# Patient Record
Sex: Female | Born: 1985 | Hispanic: Yes | Marital: Married | Smoking: Never smoker
Health system: Southern US, Community
[De-identification: ages and names within clinical notes are randomized; demographics above are authoritative.]

## PROBLEM LIST (undated history)

## (undated) DIAGNOSIS — O24419 Gestational diabetes mellitus in pregnancy, unspecified control: Secondary | ICD-10-CM

## (undated) DIAGNOSIS — O139 Gestational [pregnancy-induced] hypertension without significant proteinuria, unspecified trimester: Secondary | ICD-10-CM

## (undated) DIAGNOSIS — F410 Panic disorder [episodic paroxysmal anxiety] without agoraphobia: Secondary | ICD-10-CM

## (undated) DIAGNOSIS — I499 Cardiac arrhythmia, unspecified: Secondary | ICD-10-CM

## (undated) HISTORY — DX: Gestational (pregnancy-induced) hypertension without significant proteinuria, unspecified trimester: O13.9

## (undated) HISTORY — PX: NO PAST SURGERIES: SHX2092

## (undated) HISTORY — DX: Cardiac arrhythmia, unspecified: I49.9

## (undated) HISTORY — DX: Panic disorder (episodic paroxysmal anxiety): F41.0

## (undated) HISTORY — DX: Gestational diabetes mellitus in pregnancy, unspecified control: O24.419

---

## 2020-06-27 ENCOUNTER — Ambulatory Visit: Payer: Self-pay | Admitting: Internal Medicine

## 2020-08-06 NOTE — L&D Delivery Note (Signed)
OB/GYN Faculty Practice Delivery Note  Alyssa Blackwell is a 35 y.o. [id number] s/p SVD at [redacted]w[redacted]d. She was admitted for SOL.   ROM: 4h 24m with clear fluid GBS Status: Positive/-- (06/09 1547), only received 1 dose of PCN Maximum Maternal Temperature: 97.6 F  Labor Progress: Patient presented to L&D for SOL. Initial SVE: 9.5/90/0. Labor course was uncomplicated. Baby remained high with an anterior lip of cervix despite pushing, so she labored down. She then progressed to complete and baby's head came down appropriately.   Delivery Date/Time: 01/19/21 0518 Delivery: Called to room and patient was complete and pushing. Head position was LOA and delivered with ease over the perineum. No nuchal cord present. Shoulder and body delivered in usual fashion. Infant with spontaneous cry, placed on mother's abdomen, dried and stimulated. Cord clamped x 2 after 1-minute delay, and cut by FOB. Cord blood drawn. Placenta delivered spontaneously with gentle cord traction. Fundus firm with massage and pitocin started. Labia, perineum, vagina, and cervix inspected, no lacerations .  Baby Weight: pending  Cord: central insertion, 3 vessel Placenta: Sent to L&D Complications: None Lacerations: none EBL: 150cc Analgesia: Epidural   Infant: APGAR (1 MIN): 8   APGAR (5 MINS): 9    Will Gwynn Crossley MD, PGY-1 OBGYN Faculty Teaching Service  01/19/2021, 5:38 AM

## 2020-08-12 ENCOUNTER — Other Ambulatory Visit (HOSPITAL_COMMUNITY)
Admission: RE | Admit: 2020-08-12 | Discharge: 2020-08-12 | Disposition: A | Discharge status: Home / Self Care | Payer: Self-pay | Source: Ambulatory Visit | Attending: Obstetrics & Gynecology | Admitting: Obstetrics & Gynecology

## 2020-08-12 ENCOUNTER — Encounter: Payer: Self-pay | Admitting: Obstetrics & Gynecology

## 2020-08-12 ENCOUNTER — Ambulatory Visit (INDEPENDENT_AMBULATORY_CARE_PROVIDER_SITE_OTHER): Payer: Self-pay | Admitting: Obstetrics & Gynecology

## 2020-08-12 ENCOUNTER — Encounter: Payer: Self-pay | Admitting: Obstetrics and Gynecology

## 2020-08-12 ENCOUNTER — Other Ambulatory Visit: Payer: Self-pay

## 2020-08-12 VITALS — BP 128/83 | HR 101 | Ht 61.42 in | Wt 184.0 lb

## 2020-08-12 DIAGNOSIS — Z3492 Encounter for supervision of normal pregnancy, unspecified, second trimester: Secondary | ICD-10-CM | POA: Insufficient documentation

## 2020-08-12 DIAGNOSIS — Z8632 Personal history of gestational diabetes: Secondary | ICD-10-CM

## 2020-08-12 DIAGNOSIS — I499 Cardiac arrhythmia, unspecified: Secondary | ICD-10-CM

## 2020-08-12 DIAGNOSIS — O0990 Supervision of high risk pregnancy, unspecified, unspecified trimester: Secondary | ICD-10-CM | POA: Insufficient documentation

## 2020-08-12 DIAGNOSIS — O09299 Supervision of pregnancy with other poor reproductive or obstetric history, unspecified trimester: Secondary | ICD-10-CM

## 2020-08-12 LAB — POCT URINALYSIS DIP (DEVICE)
Bilirubin Urine: NEGATIVE
Glucose, UA: NEGATIVE mg/dL
Ketones, ur: NEGATIVE mg/dL
Leukocytes,Ua: NEGATIVE
Nitrite: NEGATIVE
Protein, ur: NEGATIVE mg/dL
Specific Gravity, Urine: >=1.03 (ref 1.005–1.030)
Urobilinogen, UA: 0.2 mg/dL (ref 0.0–1.0)
pH: 5 (ref 5.0–8.0)

## 2020-08-12 NOTE — Progress Notes (Signed)
History:   Alyssa Blackwell is a 35 y.o. [id number] at [redacted]w[redacted]d by LMP being seen today for her first obstetrical visit.  Her obstetrical history is significant for arrhythmias with each pregnancy.  First four pregnancies were in [place]  5th pregnancy was in [place].  She reports the symptoms start in each pregnancy after 20 weeks.  During the last pregnancy, she was hospitalized in [place].  Echo was done.  Cardiac catheterization was recommended as well.  She did have a syncopal episode during the 5th pregnancy.  Pt reports it feels like her heart is beating very fast.  When this happens, she will feel light headed and see flashes of light.  She's never worn a holter monitor    From pregnancy standpoint.  Patient does intend to breast feed. Pregnancy history fully reviewed.  Pt is considering this as her last pregnancy if it is a girl.  Her youngest son has autism.  Would like Depo provera post partum.  Patient reports breast tenderness. She is having nausea and emesis.  She has this with her last pregnancy.  Dramamine and b Vitamins help.     HISTORY: OB History  Gravida Para Term Preterm AB Living  6 4 3 1 1 3   SAB IAB Ectopic Multiple Live Births  1 0 0 0 3    # Outcome Date GA Lbr Len/2nd Weight Sex Delivery Anes PTL Lv  6 Current           5 Term 2019     Vag-Spont     4 SAB 2016          3 Term 2013     Vag-Spont     2 Preterm 2010     Vag-Spont   ND  1 Term 2008     Vag-Spont       Last pap smear was done 2021 and was normal.  This was done at the [facility].    Past Medical History:  Diagnosis Date  . Arrhythmia    occurs only in pregnancy, had evaluation in [facility] with 5th pregnancy, normal evaluation per pt   History reviewed. No pertinent surgical history. Family History  Problem Relation Age of Onset  . Heart attack Father   . Hypertension Paternal Grandmother    Social History   Tobacco Use  . Smoking status: Never Smoker  . Smokeless tobacco:  Never Used  Vaping Use  . Vaping Use: Never used  Substance Use Topics  . Alcohol use: Never  . Drug use: Never   Not on File Current Outpatient Medications on File Prior to Visit  Medication Sig Dispense Refill  . Prenatal Vit-Fe Fumarate-FA (PRENATAL VITAMIN PO) Take by mouth.     No current facility-administered medications on file prior to visit.    Review of Systems Pertinent items noted in HPI and remainder of comprehensive ROS otherwise negative.  Physical Exam:   Vitals:   08/12/20 1103 08/12/20 1105  BP: 128/83   Pulse: (!) 101   Weight: 184 lb (83.5 kg)   Height:  5' 1.42" (1.56 m)   Fetal Heart Rate (bpm): 160  Patient informed that the ultrasound is considered a limited obstetric ultrasound and is not intended to be a complete ultrasound exam.  Patient also informed that the ultrasound is not being completed with the intent of assessing for fetal or placental anomalies or any pelvic abnormalities.  Explained that the purpose of today's ultrasound is to assess for fetal  heart rate.  Patient acknowledges the purpose of the exam and the limitations of the study. General: well-developed, well-nourished female in no acute distress  Breasts:  normal appearance, no masses or tenderness bilaterally  Skin: normal coloration and turgor, no rashes  Neurologic: oriented, normal, negative, normal mood  Extremities: normal strength, tone, and muscle mass, ROM of all joints is normal  HEENT PERRLA, extraocular movement intact and sclera clear, anicteric  Neck supple and no masses  Cardiovascular: regular rate and rhythm  Respiratory:  no respiratory distress, normal breath sounds  Abdomen: soft, non-tender; bowel sounds normal; no masses,  no organomegaly  Pelvic: normal external genitalia, no lesions, normal vaginal mucosa, normal vaginal discharge, normal cervix, pap smear was not obtained. Uterine size:  16-18 weeks    Assessment:    Pregnancy: [id number] Patient Active  Problem List   Diagnosis Date Noted  . Supervision of high risk pregnancy, antepartum 08/12/2020  . History of gestational diabetes in prior pregnancy, currently pregnant 08/12/2020     Plan:    1. Supervision of high risk pregnancy, antepartum - Prenatal Vit-Fe Fumarate-FA (PRENATAL VITAMIN PO); Take by mouth. - Culture, OB Urine - GC/Chlamydia probe amp ([facility])not at [facility] - Genetic Screening - CBC/D/Plt+RPR+Rh+ABO+Rub Ab... - Hemoglobin A1c - [place] MFM OB COMP + 14 WK; Future  2. History of gestational diabetes in prior pregnancy, currently pregnant  3. Cardiac arrhythmia, unspecified cardiac arrhythmia type - Ambulatory referral to Cardiology  Initial labs drawn. Continue prenatal vitamins. Problem list reviewed and updated. Genetic Screening discussed, Quad screen and NIPS: requested. Ultrasound discussed; fetal anatomic survey: requested. Anticipatory guidance about prenatal visits given including labs, ultrasounds, and testing. Discussed usage of Babyscripts and virtual visits as additional source of managing and completing prenatal visits in midst of coronavirus and pandemic.   Encouraged to complete MyChart Registration for her ability to review results, send requests, and have questions addressed.  The nature of [facility]/Faculty Practice with multiple MDs and Advanced Practice Providers was explained to patient; also emphasized that residents, students are part of our team. Routine obstetric precautions reviewed. Encouraged to seek out care at office or emergency room [facility] preferred) for urgent and/or emergent concerns. Return in about 4 weeks (around 09/09/2020).     Lum Keas, MD, FACOG Obstetrician & Gynecologist, [facility] \

## 2020-08-13 LAB — CBC/D/PLT+RPR+RH+ABO+RUB AB...
Antibody Screen: NEGATIVE
Basophils Absolute: 0.1 10*3/uL (ref 0.0–0.2)
Basos: 1 %
EOS (ABSOLUTE): 0.1 10*3/uL (ref 0.0–0.4)
Eos: 1 %
HCV Ab: <0.1 s/co ratio (ref 0.0–0.9)
HIV Screen 4th Generation wRfx: NONREACTIVE
Hematocrit: 36.6 % (ref 34.0–46.6)
Hemoglobin: 12.8 g/dL (ref 11.1–15.9)
Hepatitis B Surface Ag: NEGATIVE
Immature Grans (Abs): 0.1 10*3/uL (ref 0.0–0.1)
Immature Granulocytes: 1 %
Lymphocytes Absolute: 2.5 10*3/uL (ref 0.7–3.1)
Lymphs: 26 %
MCH: 32.1 pg (ref 26.6–33.0)
MCHC: 35 g/dL (ref 31.5–35.7)
MCV: 92 fL (ref 79–97)
Monocytes Absolute: 0.7 10*3/uL (ref 0.1–0.9)
Monocytes: 7 %
Neutrophils Absolute: 6.1 10*3/uL (ref 1.4–7.0)
Neutrophils: 64 %
Platelets: 211 10*3/uL (ref 150–450)
RBC: 3.99 x10E6/uL (ref 3.77–5.28)
RDW: 12 % (ref 11.7–15.4)
RPR Ser Ql: NONREACTIVE
Rh Factor: POSITIVE
Rubella Antibodies, IGG: 10.8 index (ref >0.99)
WBC: 9.6 10*3/uL (ref 3.4–10.8)

## 2020-08-13 LAB — HCV INTERPRETATION

## 2020-08-13 LAB — HEMOGLOBIN A1C
Est. average glucose Bld gHb Est-mCnc: 100 mg/dL
Hgb A1c MFr Bld: 5.1 % (ref 4.8–5.6)

## 2020-08-14 LAB — CULTURE, OB URINE

## 2020-08-14 LAB — URINE CULTURE, OB REFLEX: Organism ID, Bacteria: NO GROWTH

## 2020-08-15 LAB — GC/CHLAMYDIA PROBE AMP (~~LOC~~) NOT AT ARMC
Chlamydia: NEGATIVE
Comment: NEGATIVE
Comment: NORMAL
Neisseria Gonorrhea: NEGATIVE

## 2020-08-31 ENCOUNTER — Telehealth: Payer: Self-pay | Admitting: Obstetrics & Gynecology

## 2020-08-31 NOTE — Telephone Encounter (Signed)
Patient want to know if she needed her [place] before her CARDIOLOGY appointment

## 2020-09-01 NOTE — Telephone Encounter (Signed)
Called pt w/interpreter [employer]. She was informed that she may have the Cardiology referral before her ultrasound since the purpose of the ultrasound is to check the anatomy and size of the baby. Pt expressed concerns that she may be farther along in the pregnancy. I advised that we will have mote information about the due date after the ultrasound. Pt voiced understanding.

## 2020-09-01 NOTE — Progress Notes (Signed)
CARDIOLOGY CONSULT NOTE       Patient ID: Alyssa Blackwell MRN: [id number] DOB/AGE: Jan 26, 1986 34 y.o.  Admit date: (Not on file) Referring Physician: OB  Valentina Shaggy Primary Physician: Patient, No Pcp Per Primary Cardiologist: New Reason for Consultation: Arrhythmia  Active Problems:   * No active hospital problems. *   HPI:  35 y.o. referred by Valentina Shaggy OB/GYN. Patient is a [id number] currently pregnant at [redacted] weeks or so She has had 4 previous pregnancies in [place] and one in [place]. Last child has autism. She normally will get tachy palpitaitons starting around week 20. Last pregnancy hospitalized and Echo was normal Had ? Syncope during 5 th pregnancy She gets lightheaded with rapid pulse sensation sees light flashes. She has not had monitoring No history of severe anemia, thyroid disease. No high risk family history No history of WPW This will be her last pregnancy especially if she has a girl. She plans on breast feeding Using dramamine for nausea/vohmiting She has a history of gestational DM.   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 Arrhythmia    occurs only in pregnancy, had evaluation in [place] with 5th pregnancy, normal evaluation per pt    Family History  Problem Relation Age of Onset  . Heart attack Father   . Hypertension Paternal Grandmother     Social History   Socioeconomic History  . Marital status: Married    Spouse name: Not on file  . Number of children: Not on file  . Years of education: Not on file  . Highest education level: Not on file  Occupational History  . 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  Tobacco Use  . Smoking status: Never Smoker  . Smokeless tobacco: Never Used  Vaping Use  . Vaping Use: Never used  Substance and Sexual Activity  . Alcohol use: Never  . Drug use: Never  . Sexual activity: Yes     Birth control/protection: None  Other Topics Concern  . Not on file  Social History Narrative  . Not on file   Social Determinants of Health   Financial Resource Strain: Not on file  Food Insecurity: Not on file  Transportation Needs: Not on file  Physical Activity: Not on file  Stress: Not on file  Social Connections: Not on file  Intimate Partner Violence: Not on file    No past surgical history on file.    Current Outpatient Medications:  .  Prenatal Vit-Fe Fumarate-FA (PRENATAL VITAMIN PO), Take by mouth., Disp: , Rfl:     Physical Exam: Last menstrual period 05/01/2020.    Affect appropriate Healthy:  appears stated age HEENT: normal Neck supple with no adenopathy JVP normal no bruits no thyromegaly Lungs clear with no wheezing and good diaphragmatic motion Heart:  S1/S2 no murmur, no rub, gallop or click PMI normal Abdomen: benighn, mid term pregnancy  Distal pulses intact with no bruits No edema Neuro non-focal Skin warm and dry No muscular weakness   Labs:   Lab Results  Component Value Date   WBC 9.6 08/12/2020   HGB 12.8 08/12/2020   HCT 36.6 08/12/2020   MCV 92 08/12/2020   PLT 211 08/12/2020   No results for input(s): NA, K, CL, CO2, BUN, CREATININE, CALCIUM, PROT, BILITOT, ALKPHOS, ALT, AST, GLUCOSE in the last 168 hours.  Invalid input(s): LABALBU  No results found for: CKTOTAL, CKMB, CKMBINDEX, TROPONINI No results found for: CHOL No results found for: HDL No results found for: LDLCALC No results found for: TRIG No results found for: CHOLHDL No results found for: LDLDIRECT    Radiology: No results found.  EKG: SR rate 89 normal    ASSESSMENT AND PLAN:   1. Palpitations/Arrhythmia:  With previous pregnancies NSR now with normal ECG f/u echo to make sure No structural heart disease as this has not been documented before. If she develops tachycardia will Need monitor although from her description this sounds like normal  acceleration of HR with weight gain In pregnancy. She knows to lay on left side in fetal position to increase blood flow through IVC and prevent IVC compression Will see again in 3 months latter in pregnancy unless echo is abnormal Would use PRN  Inderal if needed for tachy-palpitations   Signed: Charlton Haws 09/06/2020, 3:45 PM

## 2020-09-06 ENCOUNTER — Ambulatory Visit (INDEPENDENT_AMBULATORY_CARE_PROVIDER_SITE_OTHER): Payer: Self-pay | Admitting: Cardiovascular Disease

## 2020-09-06 ENCOUNTER — Encounter: Payer: Self-pay | Admitting: Cardiovascular Disease

## 2020-09-06 ENCOUNTER — Other Ambulatory Visit: Payer: Self-pay

## 2020-09-06 VITALS — BP 110/78 | HR 89 | Ht 65.0 in | Wt 185.0 lb

## 2020-09-06 DIAGNOSIS — R000 Tachycardia, unspecified: Secondary | ICD-10-CM

## 2020-09-06 DIAGNOSIS — Z419 Encounter for procedure for purposes other than remedying health state, unspecified: Secondary | ICD-10-CM | POA: Diagnosis not present

## 2020-09-06 DIAGNOSIS — R002 Palpitations: Secondary | ICD-10-CM

## 2020-09-06 NOTE — Patient Instructions (Addendum)
Medication Instructions:  *If you need a refill on your cardiac medications before your next appointment, please call your pharmacy*  Lab Work: If you have labs (blood work) drawn today and your tests are completely normal, you will receive your results only by: . MyChart Message (if you have MyChart) OR . A paper copy in the mail If you have any lab test that is abnormal or we need to change your treatment, we will call you to review the results.  Testing/Procedures: Your physician has requested that you have an echocardiogram. Echocardiography is a painless test that uses sound waves to create images of your heart. It provides your doctor with information about the size and shape of your heart and how well your heart's chambers and valves are working. This procedure takes approximately one hour. There are no restrictions for this procedure.   Follow-Up: At [facility], you and your health needs are our priority.  As part of our continuing mission to provide you with exceptional heart care, we have created designated Provider Care Teams.  These Care Teams include your primary Cardiologist (physician) and Advanced Practice Providers (APPs -  Physician Assistants and Nurse Practitioners) who all work together to provide you with the care you need, when you need it.  We recommend signing up for the patient portal called "MyChart".  Sign up information is provided on this After Visit Summary.  MyChart is used to connect with patients for Virtual Visits (Telemedicine).  Patients are able to view lab/test results, encounter notes, upcoming appointments, etc.  Non-urgent messages can be sent to your provider as well.   To learn more about what you can do with MyChart, go to [email or web address].    Your next appointment:   3 month(s)  The format for your next appointment:   In Person  Provider:   You may see Dr. Nishan or one of the following Advanced Practice Providers on your designated  Care Team:    Jill McDaniel, NP   

## 2020-09-09 ENCOUNTER — Ambulatory Visit (INDEPENDENT_AMBULATORY_CARE_PROVIDER_SITE_OTHER): Payer: Self-pay | Admitting: Obstetrics & Gynecology

## 2020-09-09 ENCOUNTER — Other Ambulatory Visit (HOSPITAL_COMMUNITY)
Admission: RE | Admit: 2020-09-09 | Discharge: 2020-09-09 | Disposition: A | Discharge status: Home / Self Care | Payer: Medicaid Other | Source: Ambulatory Visit | Attending: Obstetrics & Gynecology | Admitting: Obstetrics & Gynecology

## 2020-09-09 VITALS — BP 115/76 | HR 94 | Wt 184.9 lb

## 2020-09-09 DIAGNOSIS — Z3A18 18 weeks gestation of pregnancy: Secondary | ICD-10-CM

## 2020-09-09 DIAGNOSIS — Z8632 Personal history of gestational diabetes: Secondary | ICD-10-CM

## 2020-09-09 DIAGNOSIS — N898 Other specified noninflammatory disorders of vagina: Secondary | ICD-10-CM | POA: Diagnosis not present

## 2020-09-09 DIAGNOSIS — K047 Periapical abscess without sinus: Secondary | ICD-10-CM

## 2020-09-09 DIAGNOSIS — O09299 Supervision of pregnancy with other poor reproductive or obstetric history, unspecified trimester: Secondary | ICD-10-CM | POA: Diagnosis not present

## 2020-09-09 DIAGNOSIS — O0990 Supervision of high risk pregnancy, unspecified, unspecified trimester: Secondary | ICD-10-CM

## 2020-09-09 MED ORDER — AMOXICILLIN 500 MG PO CAPS: 500.0000 mg | ORAL_CAPSULE | Freq: Three times a day (TID) | ORAL | 0 refills | Status: AC

## 2020-09-09 NOTE — Progress Notes (Signed)
   PRENATAL VISIT NOTE  Subjective:  Alyssa Blackwell is a 35 y.o. [id number] at [redacted]w[redacted]d being seen today for ongoing prenatal care.  She is currently monitored for the following issues for this high-risk pregnancy and has Supervision of high risk pregnancy, antepartum and History of gestational diabetes in prior pregnancy, currently pregnant on their problem list.  Patient reports about 7 days of increased pain above her right front tooth.  Has started draining.  There is tenderness to her right sinuses and underness the right side of her neck.  Unsure about fever but has felt warm..  Contractions: Not present. Vag. Bleeding: None.  Movement: Absent. Denies leaking of fluid.   The following portions of the patient's history were reviewed and updated as appropriate: allergies, current medications, past family history, past medical history, past social history, past surgical history and problem list.   Objective:   Vitals:   09/09/20 0822  BP: 115/76  Pulse: 94  Weight: 184 lb 14.4 oz (83.9 kg)    Fetal Status:     Movement: Absent     General:  Alert, oriented and cooperative. Patient is in no acute distress.  Skin: Skin is warm and dry. No rash noted.   Cardiovascular: Normal heart rate noted  Respiratory: Normal respiratory effort, no problems with respiration noted  Abdomen: Soft, gravid, appropriate for gestational age.  Pain/Pressure: Absent     Pelvic: Cervical exam deferred        Extremities: Normal range of motion.  Edema: None  Mental Status: Normal mood and affect. Normal behavior. Normal judgment and thought content.   Assessment and Plan:  Pregnancy: [id number] at [redacted]w[redacted]d 1. [redacted] weeks gestation of pregnancy - on PNV  2. Supervision of high risk pregnancy, antepartum - AFP, Serum, Open Spina Bifida  3. History of gestational diabetes in prior pregnancy, currently pregnant - HbA1C with prenatal labs was normal.   4. Dental infection - Amoxicillin 500mg  tid x 7 days.  Pt advised  to continue compressing abscess and use warm compresses to help abscess drain.  Spoke personally with provider at [facility].  Was advised she would be referred to [facility] in [place] for care due to abscess.  This office is close.  They do offer sliding scale fee schedule.  Information given to pt.  5. Vaginal odor - Self swab obtained today for vaginitis screening  Preterm labor symptoms and general obstetric precautions including but not limited to vaginal bleeding, contractions, leaking of fluid and fetal movement were reviewed in detail with the patient. Please refer to After Visit Summary for other counseling recommendations.   Return in about 4 weeks (around 10/07/2020).  Future Appointments  Date Time Provider Department Center  09/16/2020  8:45 AM [facility]-M[facility] US5 [facility]  09/26/2020  3:45 PM MC-CV CH ECHO 3 [facility]SITE3ECHO [facility]  12/12/2020  3:45 PM 02/11/2021, NP [facility]    Filbert Schilder, MD

## 2020-09-09 NOTE — Progress Notes (Signed)
In person Interpreter Raquel

## 2020-09-09 NOTE — Patient Instructions (Signed)
[facility]. [address] [place], [place]  [address] [phone]

## 2020-09-11 LAB — CERVICOVAGINAL ANCILLARY ONLY
Bacterial Vaginitis (gardnerella): NEGATIVE
Candida Glabrata: NEGATIVE
Candida Vaginitis: NEGATIVE
Comment: NEGATIVE
Comment: NEGATIVE
Comment: NEGATIVE

## 2020-09-12 ENCOUNTER — Telehealth: Payer: Self-pay | Admitting: *Deleted

## 2020-09-12 NOTE — Telephone Encounter (Addendum)
-----   Message from Jerene Bears, MD sent at 09/12/2020  3:04 PM EST ----- Please let pt know her testing for BV and yeast was negative.  She did not want repeat STD testing as she said she has no concern about this and had negative testing 08/12/2020.  2/7  1615 Called pt w/interpreter Elita Quick #[id number]. She was informed of test results, voiced understanding and had no questions.

## 2020-09-16 ENCOUNTER — Ambulatory Visit: Payer: Medicaid Other | Attending: Obstetrics & Gynecology

## 2020-09-16 ENCOUNTER — Other Ambulatory Visit: Payer: Self-pay | Admitting: Obstetrics & Gynecology

## 2020-09-16 ENCOUNTER — Other Ambulatory Visit: Payer: Self-pay | Admitting: *Deleted

## 2020-09-16 ENCOUNTER — Other Ambulatory Visit: Payer: Self-pay

## 2020-09-16 DIAGNOSIS — B9689 Other specified bacterial agents as the cause of diseases classified elsewhere: Secondary | ICD-10-CM | POA: Diagnosis not present

## 2020-09-16 DIAGNOSIS — I499 Cardiac arrhythmia, unspecified: Secondary | ICD-10-CM

## 2020-09-16 DIAGNOSIS — Z3A21 21 weeks gestation of pregnancy: Secondary | ICD-10-CM | POA: Diagnosis not present

## 2020-09-16 DIAGNOSIS — E669 Obesity, unspecified: Secondary | ICD-10-CM | POA: Insufficient documentation

## 2020-09-16 DIAGNOSIS — O0990 Supervision of high risk pregnancy, unspecified, unspecified trimester: Secondary | ICD-10-CM | POA: Diagnosis not present

## 2020-09-16 DIAGNOSIS — Z8632 Personal history of gestational diabetes: Secondary | ICD-10-CM

## 2020-09-16 DIAGNOSIS — O23592 Infection of other part of genital tract in pregnancy, second trimester: Secondary | ICD-10-CM | POA: Diagnosis not present

## 2020-09-16 DIAGNOSIS — O09299 Supervision of pregnancy with other poor reproductive or obstetric history, unspecified trimester: Secondary | ICD-10-CM

## 2020-09-17 LAB — AFP, SERUM, OPEN SPINA BIFIDA
AFP MoM: 1.26
AFP Value: 53.2 ng/mL
Gest. Age on Collection Date: 18.7 weeks
Maternal Age At EDD: 32.9 yr
OSBR Risk 1 IN: 5411
Test Results:: NEGATIVE
Weight: 185 [lb_av]

## 2020-09-26 ENCOUNTER — Ambulatory Visit (HOSPITAL_COMMUNITY): Payer: Self-pay | Attending: Cardiovascular Disease

## 2020-09-27 ENCOUNTER — Encounter (HOSPITAL_COMMUNITY): Payer: Self-pay | Admitting: Cardiovascular Disease

## 2020-10-01 ENCOUNTER — Inpatient Hospital Stay (HOSPITAL_COMMUNITY)
Admission: AD | Admit: 2020-10-01 | Discharge: 2020-10-01 | Disposition: A | Discharge status: Home / Self Care | Payer: Medicaid Other | Attending: Obstetrics and Gynecology | Admitting: Obstetrics and Gynecology

## 2020-10-01 ENCOUNTER — Other Ambulatory Visit: Payer: Self-pay

## 2020-10-01 DIAGNOSIS — B9689 Other specified bacterial agents as the cause of diseases classified elsewhere: Secondary | ICD-10-CM | POA: Diagnosis not present

## 2020-10-01 DIAGNOSIS — O23592 Infection of other part of genital tract in pregnancy, second trimester: Secondary | ICD-10-CM | POA: Insufficient documentation

## 2020-10-01 DIAGNOSIS — Z7689 Persons encountering health services in other specified circumstances: Secondary | ICD-10-CM

## 2020-10-01 DIAGNOSIS — O0990 Supervision of high risk pregnancy, unspecified, unspecified trimester: Secondary | ICD-10-CM

## 2020-10-01 DIAGNOSIS — Z789 Other specified health status: Secondary | ICD-10-CM

## 2020-10-01 DIAGNOSIS — Z3A21 21 weeks gestation of pregnancy: Secondary | ICD-10-CM | POA: Insufficient documentation

## 2020-10-01 DIAGNOSIS — N898 Other specified noninflammatory disorders of vagina: Secondary | ICD-10-CM | POA: Diagnosis present

## 2020-10-01 DIAGNOSIS — O99891 Other specified diseases and conditions complicating pregnancy: Secondary | ICD-10-CM

## 2020-10-01 DIAGNOSIS — N760 Acute vaginitis: Secondary | ICD-10-CM

## 2020-10-01 DIAGNOSIS — Z8632 Personal history of gestational diabetes: Secondary | ICD-10-CM

## 2020-10-01 DIAGNOSIS — O09299 Supervision of pregnancy with other poor reproductive or obstetric history, unspecified trimester: Secondary | ICD-10-CM

## 2020-10-01 LAB — URINALYSIS, ROUTINE W REFLEX MICROSCOPIC
Bilirubin Urine: NEGATIVE
Glucose, UA: NEGATIVE mg/dL
Hgb urine dipstick: NEGATIVE
Ketones, ur: NEGATIVE mg/dL
Nitrite: NEGATIVE
Protein, ur: NEGATIVE mg/dL
Specific Gravity, Urine: 1.028 (ref 1.005–1.030)
pH: 5 (ref 5.0–8.0)

## 2020-10-01 LAB — WET PREP, GENITAL
Sperm: NONE SEEN
Trich, Wet Prep: NONE SEEN
Yeast Wet Prep HPF POC: NONE SEEN

## 2020-10-01 MED ORDER — METRONIDAZOLE 500 MG PO TABS: 500.0000 mg | ORAL_TABLET | Freq: Two times a day (BID) | ORAL | 0 refills | Status: DC

## 2020-10-01 MED ORDER — TERCONAZOLE 0.4 % VA CREA: 1.0000 | TOPICAL_CREAM | Freq: Every day | VAGINAL | 0 refills | Status: AC

## 2020-10-01 NOTE — MAU Note (Addendum)
Presents with c/o vaginal burning, itching, and swelling that began Wednesday.  Denies vaginal discharge.  Reports has used Vagisal, but no relief noted.  Denies VB or LOF.  Endorses +FM.

## 2020-10-01 NOTE — MAU Provider Note (Signed)
History     CSN: [id number]  Arrival date and time: 10/01/20 1428   Event Date/Time   First Provider Initiated Contact with Patient 10/01/20 1712      Chief Complaint  Patient presents with  . Vaginal Itching  . Vaginal Burning & Swelling   [name] is a 35 y.o. year old G6P3113 female at [redacted]w[redacted]d weeks gestation who presents to MAU reporting vaginal burning, itching and swelling since Wednesday 09/28/2020. She reports trying Vagisil with no relief; "it actually made it worse." She denies vaginal discharge, vaginal bleeding or LOF. Her spouse is present and contributing to the history taking. She receives [facility] with [facility]; next appt is 10/10/2020.   OB History    Gravida  6   Para  4   Term  3   Preterm  1   AB  1   Living  3     SAB  1   IAB  0   Ectopic  0   Multiple  0   Live Births  3           Past Medical History:  Diagnosis Date  . Arrhythmia    occurs only in pregnancy, had evaluation in [place] with 5th pregnancy, normal evaluation per pt    No past surgical history on file.  Family History  Problem Relation Age of Onset  . Heart attack Father   . Hypertension Paternal Grandmother     Social History   Tobacco Use  . Smoking status: Never Smoker  . Smokeless tobacco: Never Used  Vaping Use  . Vaping Use: Never used  Substance Use Topics  . Alcohol use: Never  . Drug use: Never    Allergies: Not on File  Medications Prior to Admission  Medication Sig Dispense Refill Last Dose  . Prenatal Vit-Fe Fumarate-FA (PRENATAL VITAMIN PO) Take by mouth.       Review of Systems  Constitutional: Negative.   HENT: Negative.   Eyes: Negative.   Respiratory: Negative.   Cardiovascular: Negative.   Gastrointestinal: Negative.   Endocrine: Negative.   Genitourinary: Positive for vaginal pain (burning and itching). Negative for vaginal discharge.  Musculoskeletal: Negative.   Skin: Negative.   Allergic/Immunologic: Negative.    Neurological: Negative.   Hematological: Negative.   Psychiatric/Behavioral: Negative.    Physical Exam   Blood pressure 121/70, pulse 92, temperature 98.5 F (36.9 C), resp. rate 20, weight 84 kg, last menstrual period 05/01/2020, SpO2 100 %.  Physical Exam Vitals and nursing note reviewed.  Constitutional:      Appearance: Normal appearance. She is normal weight.  Genitourinary:    Comments: Vaginal swabs collected by self-swab Neurological:     Mental Status: She is alert and oriented to person, place, and time.  Psychiatric:        Mood and Affect: Mood normal.        Behavior: Behavior normal.        Thought Content: Thought content normal.        Judgment: Judgment normal.    FHTs by doppler: 142 bpm  MAU Course  Procedures  MDM CCUA Wet prep GC/CT -- Results pending    Results for orders placed or performed during the hospital encounter of 10/01/20 (from the past 24 hour(s))  Urinalysis, Routine w reflex microscopic Urine, Clean Catch     Status: Abnormal   Collection Time: 10/01/20  3:31 PM  Result Value Ref Range   Color, Urine AMBER (A) YELLOW  APPearance HAZY (A) CLEAR   Specific Gravity, Urine 1.028 1.005 - 1.030   pH 5.0 5.0 - 8.0   Glucose, UA NEGATIVE NEGATIVE mg/dL   Hgb urine dipstick NEGATIVE NEGATIVE   Bilirubin Urine NEGATIVE NEGATIVE   Ketones, ur NEGATIVE NEGATIVE mg/dL   Protein, ur NEGATIVE NEGATIVE mg/dL   Nitrite NEGATIVE NEGATIVE   Leukocytes,Ua LARGE (A) NEGATIVE   RBC / HPF 6-10 0 - 5 RBC/hpf   WBC, UA 0-5 0 - 5 WBC/hpf   Bacteria, UA RARE (A) NONE SEEN   Squamous Epithelial / LPF 0-5 0 - 5   Mucus PRESENT   Wet prep, genital     Status: Abnormal   Collection Time: 10/01/20  3:51 PM  Result Value Ref Range   Yeast Wet Prep HPF POC NONE SEEN NONE SEEN   Trich, Wet Prep NONE SEEN NONE SEEN   Clue Cells Wet Prep HPF POC PRESENT (A) NONE SEEN   WBC, Wet Prep HPF POC MANY (A) NONE SEEN   Sperm NONE SEEN      Assessment and Plan   1. Bacterial vaginosis - Rx for Flagyl 500 mg BID x 7 days - Information provided on bacterial vaginosis and vaginitis - Advised to take all the medication as prescribed  2. Vaginal irritation  - Rx for Terazol cream to be used internally and externally hs x 7 days  3. [redacted] weeks gestation of pregnancy  4. Language barrier affecting health care - [employer] in-person Spanish Interpreter, Harl Favor used for entire visit   - Discharge patient - Keep scheduled appt with [facility] on 10/10/2020 - Patient verbalized an understanding of the plan of care and agrees.     Alyssa Blackwell, CNM 10/01/2020, 5:13 PM

## 2020-10-01 NOTE — Discharge Instructions (Signed)
Se han [employer] recetas a su farmacia. Por favor, vaya a recogerlo y tome los medicamentos segn lo prescrito. Use la crema que le recetaron en el interior y el exterior de su vagina a la hora de Teacher, music

## 2020-10-02 LAB — CULTURE, OB URINE: Culture: NO GROWTH

## 2020-10-03 ENCOUNTER — Telehealth: Payer: Self-pay

## 2020-10-03 LAB — GC/CHLAMYDIA PROBE AMP (~~LOC~~) NOT AT ARMC
Chlamydia: NEGATIVE
Comment: NEGATIVE
Comment: NORMAL
Neisseria Gonorrhea: NEGATIVE

## 2020-10-03 NOTE — Telephone Encounter (Signed)
Created GFE for patient and sent via mail.

## 2020-10-04 DIAGNOSIS — Z419 Encounter for procedure for purposes other than remedying health state, unspecified: Secondary | ICD-10-CM | POA: Diagnosis not present

## 2020-10-10 ENCOUNTER — Telehealth (INDEPENDENT_AMBULATORY_CARE_PROVIDER_SITE_OTHER): Payer: Self-pay | Admitting: Obstetrics and Gynecology

## 2020-10-10 ENCOUNTER — Telehealth: Payer: Self-pay | Admitting: General Practice

## 2020-10-10 DIAGNOSIS — Z8709 Personal history of other diseases of the respiratory system: Secondary | ICD-10-CM

## 2020-10-10 DIAGNOSIS — O99891 Other specified diseases and conditions complicating pregnancy: Secondary | ICD-10-CM

## 2020-10-10 DIAGNOSIS — Z3A23 23 weeks gestation of pregnancy: Secondary | ICD-10-CM

## 2020-10-10 DIAGNOSIS — Z603 Acculturation difficulty: Secondary | ICD-10-CM

## 2020-10-10 DIAGNOSIS — Z789 Other specified health status: Secondary | ICD-10-CM

## 2020-10-10 DIAGNOSIS — O0992 Supervision of high risk pregnancy, unspecified, second trimester: Secondary | ICD-10-CM

## 2020-10-10 DIAGNOSIS — O0990 Supervision of high risk pregnancy, unspecified, unspecified trimester: Secondary | ICD-10-CM

## 2020-10-10 DIAGNOSIS — K047 Periapical abscess without sinus: Secondary | ICD-10-CM

## 2020-10-10 DIAGNOSIS — O09292 Supervision of pregnancy with other poor reproductive or obstetric history, second trimester: Secondary | ICD-10-CM

## 2020-10-10 DIAGNOSIS — O09299 Supervision of pregnancy with other poor reproductive or obstetric history, unspecified trimester: Secondary | ICD-10-CM

## 2020-10-10 DIAGNOSIS — N760 Acute vaginitis: Secondary | ICD-10-CM | POA: Insufficient documentation

## 2020-10-10 DIAGNOSIS — Z87898 Personal history of other specified conditions: Secondary | ICD-10-CM

## 2020-10-10 DIAGNOSIS — Z8632 Personal history of gestational diabetes: Secondary | ICD-10-CM

## 2020-10-10 DIAGNOSIS — Z8742 Personal history of other diseases of the female genital tract: Secondary | ICD-10-CM

## 2020-10-10 NOTE — Progress Notes (Signed)
Patient reports being seen in MAU recently and was dx with BV and yeast infection. Patient completed flagyl Rx but had to stop using terazol due to increased burning. She reports continued vaginal irritation/itching.

## 2020-10-10 NOTE — Telephone Encounter (Signed)
Called [facility] and was informed they cannot see patients without insurance or Medicaid. They recommended [facility] in [place] because they offer a sliding scale. Called patient with pacific interpreter [phone] and provided office information to patient. Patient verbalized understanding.

## 2020-10-10 NOTE — Progress Notes (Signed)
TELEHEALTH VIRTUAL OBSTETRICS VISIT ENCOUNTER NOTE  Clinic: [facility] for [facility]  I connected with [name] on 10/10/20 at  9:35 AM EST by telephone at home and verified that I am speaking with the correct person using two identifiers.   I discussed the limitations, risks, security and privacy concerns of performing an evaluation and management service by telephone and the availability of in person appointments. I also discussed with the patient that there may be a patient responsible charge related to this service. The patient expressed understanding and agreed to proceed.  Subjective:  Alyssa Blackwell is a 35 y.o. [id number] at [redacted]w[redacted]d being followed for ongoing prenatal care.  She is currently monitored for the following issues for this high-risk pregnancy and has Supervision of high risk pregnancy, antepartum; History of gestational diabetes in prior pregnancy, currently pregnant; Vulvovaginitis; Language barrier; and Dental abscess on their problem list.  Patient reports see below. Reports fetal movement. Denies any contractions, bleeding or leaking of fluid.   The following portions of the patient's history were reviewed and updated as appropriate: allergies, current medications, past family history, past medical history, past social history, past surgical history and problem list.   Objective:  There were no vitals filed for this visit.  Babyscripts Data Reviewed: not applicable  General:  Alert, oriented and cooperative.   Mental Status: Normal mood and affect perceived. Normal judgment and thought content.  Rest of physical exam deferred due to type of encounter  Assessment and Plan:  Pregnancy: [id number] at [redacted]w[redacted]d 1. Supervision of high risk pregnancy, antepartum Patient does not have a home BP cuff. Notation made in chart for in person visits only Patient has f/u u/s on 3/11 so will see for visit then  2. History of gestational diabetes in prior pregnancy,  currently pregnant Neg a1c early in pregnancy. F/u 2h GTT later in pregnancy  3. [redacted] weeks gestation of pregnancy  4. Vulvovaginitis Pt states she took the flagyl and the terazol she got in triage caused worse s/s. Recommend exam at nv  5. Dental abscess Pt states she called the number given, for the place in [place], but states they wouldn't take her w/o insurance. I recommend we just refer her to the [facility] at her nv  6. History of dyspnea Seen by cardiology early in February with normal ECG and plan for echo. Pt no show'ed to her 2/21 echo visit due to family reasons. Pt told to please call them to reschedule. She states she gets some SOB with food such as milk, dairy, coffee, caffeine; I told her I recommend avoiding triggering foods  7. Language barrier Interpreter used.   Preterm labor symptoms and general obstetric precautions including but not limited to vaginal bleeding, contractions, leaking of fluid and fetal movement were reviewed in detail with the patient.  I discussed the assessment and treatment plan with the patient. The patient was provided an opportunity to ask questions and all were answered. The patient agreed with the plan and demonstrated an understanding of the instructions. The patient was advised to call back or seek an in-person office evaluation/go to [facility]U at [facility] for any urgent or concerning symptoms. Please refer to After Visit Summary for other counseling recommendations.   I provided 15 minutes of non-face-to-face time during this encounter. The visit was conducted via telephone-medicine  Return in about 4 days (around 10/14/2020) for in person with app.  Future Appointments  Date Time Provider Department Center  10/14/2020  8:00  AM [facility] US1 [facility]  12/12/2020  3:45 PM Filbert Schilder, NP [facility]    Rand Bing, MD Center for [facility], [facility]

## 2020-10-10 NOTE — Progress Notes (Signed)
[id number]-- Called patient with [employer]quel for Spanish interpreter, no answer- unable to leave voicemail as voicemail has not been set up yet.

## 2020-10-11 ENCOUNTER — Telehealth (HOSPITAL_COMMUNITY): Payer: Self-pay | Admitting: Cardiovascular Disease

## 2020-10-11 NOTE — Telephone Encounter (Signed)
Just an FYI. We have made several attempts to contact this patient including sending a letter to schedule or reschedule their echocardiogram. We will be removing the patient from the echo WQ.  09/26/20 NO SHOWED-MAILED LETTER [facility]      Thank you

## 2020-10-11 NOTE — Telephone Encounter (Signed)
Will route this message to the pts Ordering MD and covering RN as a general FYI.

## 2020-10-14 ENCOUNTER — Other Ambulatory Visit: Payer: Self-pay

## 2020-10-14 ENCOUNTER — Ambulatory Visit (INDEPENDENT_AMBULATORY_CARE_PROVIDER_SITE_OTHER): Payer: Self-pay | Admitting: Obstetrics and Gynecology

## 2020-10-14 ENCOUNTER — Ambulatory Visit: Payer: Medicaid Other | Attending: Maternal & Fetal Medicine

## 2020-10-14 ENCOUNTER — Other Ambulatory Visit: Payer: Self-pay | Admitting: *Deleted

## 2020-10-14 VITALS — BP 120/84 | HR 95 | Wt 189.1 lb

## 2020-10-14 DIAGNOSIS — Z3A23 23 weeks gestation of pregnancy: Secondary | ICD-10-CM

## 2020-10-14 DIAGNOSIS — N760 Acute vaginitis: Secondary | ICD-10-CM

## 2020-10-14 DIAGNOSIS — O0990 Supervision of high risk pregnancy, unspecified, unspecified trimester: Secondary | ICD-10-CM

## 2020-10-14 DIAGNOSIS — Z789 Other specified health status: Secondary | ICD-10-CM

## 2020-10-14 DIAGNOSIS — K047 Periapical abscess without sinus: Secondary | ICD-10-CM

## 2020-10-14 DIAGNOSIS — O09299 Supervision of pregnancy with other poor reproductive or obstetric history, unspecified trimester: Secondary | ICD-10-CM | POA: Diagnosis not present

## 2020-10-14 DIAGNOSIS — Z8632 Personal history of gestational diabetes: Secondary | ICD-10-CM

## 2020-10-14 DIAGNOSIS — O09899 Supervision of other high risk pregnancies, unspecified trimester: Secondary | ICD-10-CM

## 2020-10-14 NOTE — Progress Notes (Signed)
Patient stated she is having a lot of itching and burning on the outside of her vaginal 2 weeks ago, and it is better now.

## 2020-10-14 NOTE — Progress Notes (Signed)
Prenatal Visit Note Date: 10/14/2020 Clinic: [facility]  Subjective:  Alyssa Blackwell is a 35 y.o. [id number] at [redacted]w[redacted]d being seen today for ongoing prenatal care.  She is currently monitored for the following issues for this low-risk pregnancy and has Supervision of high risk pregnancy, antepartum; History of gestational diabetes in prior pregnancy, currently pregnant; Vulvovaginitis; Language barrier; and Dental abscess on their problem list.  Patient reports resolving vaginitis s/s.   Contractions: Not present. Vag. Bleeding: None.  Movement: Present. Denies leaking of fluid.   The following portions of the patient's history were reviewed and updated as appropriate: allergies, current medications, past family history, past medical history, past social history, past surgical history and problem list. Problem list updated.  Objective:   Vitals:   10/14/20 0927  BP: 120/84  Pulse: 95  Weight: 189 lb 1.6 oz (85.8 kg)    Fetal Status: Fetal Heart Rate (bpm): 140   Movement: Present     General:  Alert, oriented and cooperative. Patient is in no acute distress.  Skin: Skin is warm and dry. No rash noted.   Cardiovascular: Normal heart rate noted  Respiratory: Normal respiratory effort, no problems with respiration noted  Abdomen: Soft, gravid, appropriate for gestational age. Pain/Pressure: Present     Pelvic:  EGBUS normal Introitus normal  Extremities: Normal range of motion.  Edema: None  Mental Status: Normal mood and affect. Normal behavior. Normal judgment and thought content.   Urinalysis:      Assessment and Plan:  Pregnancy: [id number] at [redacted]w[redacted]d  1. Supervision of high risk pregnancy, antepartum Routine care. 28wk labs nv Had completion anatomy scan with [facility] this morning. Results pending.  - HIV Antibody (routine testing w rflx); Future - RPR; Future - Glucose Tolerance, 2 Hours w/1 Hour; Future - CBC; Future  2. Vulvovaginitis Normal exam.   3.  Language barrier  4. Dental abscess RN to schedule appt with [facility] dental clinic; pt had issues with one in [place]  5. [redacted] weeks gestation of pregnancy  Interpreter used   Preterm labor symptoms and general obstetric precautions including but not limited to vaginal bleeding, contractions, leaking of fluid and fetal movement were reviewed in detail with the patient. Please refer to After Visit Summary for other counseling recommendations.  Return in about 1 month (around 11/14/2020) for in person, low risk, md or app, 2hr GTT.   [place] Bing, MD

## 2020-10-14 NOTE — Patient Instructions (Signed)
[facility] [place]  [phone]

## 2020-10-24 DIAGNOSIS — O09299 Supervision of pregnancy with other poor reproductive or obstetric history, unspecified trimester: Secondary | ICD-10-CM

## 2020-10-24 DIAGNOSIS — Z8632 Personal history of gestational diabetes: Secondary | ICD-10-CM

## 2020-10-24 DIAGNOSIS — O0990 Supervision of high risk pregnancy, unspecified, unspecified trimester: Secondary | ICD-10-CM

## 2020-11-01 ENCOUNTER — Telehealth: Payer: Self-pay | Admitting: Family Medicine
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
## 2020-11-04 DIAGNOSIS — Z419 Encounter for procedure for purposes other than remedying health state, unspecified: Secondary | ICD-10-CM | POA: Diagnosis not present

## 2020-11-11 ENCOUNTER — Other Ambulatory Visit: Payer: Self-pay

## 2020-11-11 ENCOUNTER — Encounter: Payer: Self-pay | Admitting: Medical

## 2020-11-11 ENCOUNTER — Ambulatory Visit (INDEPENDENT_AMBULATORY_CARE_PROVIDER_SITE_OTHER): Payer: Medicaid Other | Admitting: Medical

## 2020-11-11 VITALS — BP 129/86 | HR 105 | Wt 189.0 lb

## 2020-11-11 DIAGNOSIS — O0990 Supervision of high risk pregnancy, unspecified, unspecified trimester: Secondary | ICD-10-CM

## 2020-11-11 DIAGNOSIS — O09299 Supervision of pregnancy with other poor reproductive or obstetric history, unspecified trimester: Secondary | ICD-10-CM

## 2020-11-11 DIAGNOSIS — M549 Dorsalgia, unspecified: Secondary | ICD-10-CM

## 2020-11-11 DIAGNOSIS — Z3A27 27 weeks gestation of pregnancy: Secondary | ICD-10-CM

## 2020-11-11 DIAGNOSIS — Z23 Encounter for immunization: Secondary | ICD-10-CM

## 2020-11-11 DIAGNOSIS — Z8632 Personal history of gestational diabetes: Secondary | ICD-10-CM

## 2020-11-11 DIAGNOSIS — O99891 Other specified diseases and conditions complicating pregnancy: Secondary | ICD-10-CM

## 2020-11-11 MED ORDER — CYCLOBENZAPRINE HCL 10 MG PO TABS
10.0000 mg | ORAL_TABLET | Freq: Three times a day (TID) | ORAL | 0 refills | Status: DC | PRN
Start: 2020-11-11 — End: 2020-11-21

## 2020-11-11 NOTE — Patient Instructions (Addendum)
Evaluación de los movimientos fetales °Fetal Movement Counts °Nombre del paciente: ________________________________________________ Fecha de parto estimada: ____________________ ° °¿Qué es una evaluación de los movimientos fetales? °Una evaluación de los movimientos fetales es el registro del número de veces que siente que el bebé se mueve durante un cierto período de tiempo. Esto también se puede denominar recuento de patadas fetales. Una evaluación de movimientos fetales se recomienda a todas las embarazadas. Es posible que le indiquen que comience a evaluar los movimientos fetales desde la semana 28 de embarazo. °Preste atención cuando sienta que el bebé está más activo. Podrá detectar los ciclos en que el bebé duerme y está despierto. También podrá detectar que ciertas cosas hacen que su bebé se mueva más. Deberá realizar una evaluación de los movimientos fetales en las siguientes situaciones: °· Cuando el bebé está más activo habitualmente. °· A la misma hora, todos los días. °Un buen momento para evaluar los movimientos fetales es cuando está descansando, después de haber comido y bebido algo. °¿Cómo debo contar los movimientos fetales? °1. Encuentre un lugar tranquilo y cómodo. Siéntese o acuéstese de lado. °2. Anote la fecha, la hora de inicio y de finalización y la cantidad de movimientos que sintió entre esas dos horas. Lleve esta información a las visitas de control. °3. Anote la hora de inicio cuando sienta el primer movimiento. °4. Cuente las pataditas, revoloteos, chasquidos, vueltas o pinchazos. Debe sentir al menos 10 movimientos. °5. Puede dejar de contar después de haber sentido 10 movimientos o de haber contado durante 2 horas. Anote la hora de finalización. °6. Si no siente 10 movimientos en 2 horas, comuníquese con su médico para obtener más indicaciones. Es posible que el médico quiera realizar estudios adicionales para evaluar el bienestar del bebé. °Comuníquese con un médico si: °· Siente  menos de 10 movimientos en 2 horas. °· El bebé no se mueve tanto como suele hacerlo. °Fecha: ____________ Hora de inicio: ____________ Hora de finalización: ____________ Movimientos: ____________ °Fecha: ____________ Hora de inicio: ____________ Hora de finalización: ____________ Movimientos: ____________ °Fecha: ____________ Hora de inicio: ____________ Hora de finalización: ____________ Movimientos: ____________ °Fecha: ____________ Hora de inicio: ____________ Hora de finalización: ____________ Movimientos: ____________ °Fecha: ____________ Hora de inicio: ____________ Hora de finalización: ____________ Movimientos: ____________ °Fecha: ____________ Hora de inicio: ____________ Hora de finalización: ____________ Movimientos: ____________ °Fecha: ____________ Hora de inicio: ____________ Hora de finalización: ____________ Movimientos: ____________ °Fecha: ____________ Hora de inicio: ____________ Hora de finalización: ____________ Movimientos: ____________ °Fecha: ____________ Hora de inicio: ____________ Hora de finalización: ____________ Movimientos: ____________ °Esta información no tiene como fin reemplazar el consejo del médico. Asegúrese de hacerle al médico cualquier pregunta que tenga. °Document Revised: 05/19/2019 Document Reviewed: 05/19/2019 °Elsevier Patient Education © 2021 Elsevier Inc. °Rosen's Emergency Medicine: Concepts and Clinical Practice (9th ed., pp. 2296- 2312). Elsevier.">  °Contracciones de Braxton Hicks °Braxton Hicks Contractions °Las contracciones del útero pueden presentarse durante todo el embarazo, pero no siempre indican que la mujer está de parto. Es posible que usted haya tenido contracciones de práctica llamadas "contracciones de Braxton Hicks". A veces, se las confunde con el parto real. °¿Qué son las contracciones de Braxton Hicks? °Las contracciones de Braxton Hicks son espasmos que se producen en los músculos del útero antes del parto. A diferencia de las contracciones del  parto verdadero, estas no producen el agrandamiento (la dilatación) ni el afinamiento del cuello uterino. Hacia el final del embarazo (entre las semanas 32 y 34), las contracciones de Braxton Hicks pueden presentarse más seguido y tornarse más intensas. A veces, resulta difícil distinguirlas del   parto verdadero porque pueden ser muy molestas. No debe sentirse avergonzada si concurre al hospital con falso parto. °En ocasiones, la única forma de saber si el trabajo de parto es verdadero es que el médico determine si hay cambios en el cuello del útero. El médico le hará un examen físico y quizás le controle las contracciones. Si usted no está de parto verdadero, el examen debe indicar que el cuello uterino no está dilatado y que usted no ha roto bolsa. °Si no hay otros problemas de salud asociados con su embarazo, no habrá inconvenientes si la envían a su casa con un falso parto. Es posible que las contracciones de Braxton Hicks continúen hasta que se desencadene el parto verdadero. °Cómo diferenciar el trabajo de parto falso del verdadero °Trabajo de parto verdadero °· Las contracciones duran de 30 a 70 segundos. °· Las contracciones pueden tornarse muy regulares. °· La molestia generalmente se siente en la parte superior del útero y se extiende hacia la zona baja del abdomen y hacia la cintura. °· Las contracciones no desaparecen cuando usted camina. °· Las contracciones generalmente se hacen más intensas y aumentan en frecuencia. °· El cuello uterino se dilata y se afina. °Parto falso °· En general, las contracciones son más cortas y no tan intensas como las del parto verdadero. °· En general, las contracciones son irregulares. °· A menudo, las contracciones se sienten en la parte delantera de la parte baja del abdomen y en la ingle. °· Las contracciones pueden desaparecer cuando usted camina o cambia de posición mientras está acostada. °· Las contracciones se vuelven más débiles y su duración es menor a medida que  transcurre el tiempo. °· En general, el cuello uterino no se dilata ni se afina. °Siga estas indicaciones en su casa: °· Tome los medicamentos de venta libre y los recetados solamente como se lo haya indicado el médico. °· Continúe haciendo los ejercicios habituales y siga las demás indicaciones que el médico le dé. °· Coma y beba con moderación si cree que está de parto. °· Si las contracciones de Braxton Hicks le provocan incomodidad: °? Cambie de posición: si está acostada o descansando, camine; si está caminando, descanse. °? Siéntese y descanse en una bañera con agua tibia. °? Beba suficiente líquido como para mantener la orina de color amarillo pálido. La deshidratación puede provocar contracciones. °? Respire lenta y profundamente varias veces por hora. °· Vaya a todas las visitas de control prenatales y de control como se lo haya indicado el médico. Esto es importante.   °Comuníquese con un médico si: °· Tiene fiebre. °· Siente dolor constante en el abdomen. °Solicite ayuda de inmediato si: °· Las contracciones se intensifican, se hacen más regulares y cercanas entre sí. °· Tiene una pérdida de líquido por la vagina. °· Elimina una mucosidad sanguinolenta (pérdida del tapón mucoso). °· Tiene una hemorragia vaginal. °· Tiene un dolor en la zona lumbar que nunca tuvo antes. °· Siente que la cabeza del bebé empuja hacia abajo y ejerce presión en la zona pélvica. °· El bebé no se mueve tanto como antes. °Resumen °· Las contracciones que se presentan antes del parto se conocen como contracciones de Braxton Hicks, falso parto o contracciones de práctica. °· En general, las contracciones de Braxton Hicks son más cortas, más débiles, con más tiempo entre una y otra, y menos regulares que las contracciones del parto verdadero. Las contracciones del parto verdadero se intensifican progresivamente y se tornan regulares y más frecuentes. °· Para controlar la molestia que   producen las contracciones de Braxton Hicks,  puede cambiar de posición, darse un baño templado y descansar, beber mucha agua o practicar la respiración profunda. °Esta información no tiene como fin reemplazar el consejo del médico. Asegúrese de hacerle al médico cualquier pregunta que tenga. °Document Revised: 11/01/2017 Document Reviewed: 03/04/2017 °Elsevier Patient Education © 2021 Elsevier Inc. ° °

## 2020-11-11 NOTE — Progress Notes (Signed)
   PRENATAL VISIT NOTE  Subjective:  Alyssa Blackwell is a 35 y.o. [id number] at [redacted]w[redacted]d being seen today for ongoing prenatal care.  She is currently monitored for the following issues for this high-risk pregnancy and has Supervision of high risk pregnancy, antepartum; History of gestational diabetes in prior pregnancy, currently pregnant; Vulvovaginitis; Language barrier; and Dental abscess on their problem list.  Patient reports backache.  Contractions: Not present. Vag. Bleeding: None.  Movement: Present. Denies leaking of fluid.   The following portions of the patient's history were reviewed and updated as appropriate: allergies, current medications, past family history, past medical history, past social history, past surgical history and problem list.   Objective:   Vitals:   11/11/20 0852  BP: 129/86  Pulse: (!) 105  Weight: 189 lb (85.7 kg)    Fetal Status: Fetal Heart Rate (bpm): 145   Movement: Present     General:  Alert, oriented and cooperative. Patient is in no acute distress.  Skin: Skin is warm and dry. No rash noted.   Cardiovascular: Normal heart rate noted  Respiratory: Normal respiratory effort, no problems with respiration noted  Abdomen: Soft, gravid, appropriate for gestational age.  Pain/Pressure: Present     Pelvic: Cervical exam deferred        Extremities: Normal range of motion.  Edema: Trace  Mental Status: Normal mood and affect. Normal behavior. Normal judgment and thought content.   Assessment and Plan:  Pregnancy: [id number] at [redacted]w[redacted]d 1. Supervision of high risk pregnancy, antepartum - Tdap vaccine greater than or equal to 7yo IM - 2 hour GTT, CBC, HIV, RPR today  - Will use [facility]  2. History of gestational diabetes in prior pregnancy, currently pregnant  3. Back pain affecting pregnancy in second trimester - Patient states she fell on her right side ~ 3 weeks ago - Continues to have low back pain - Discussed ice and heat to the area -  cyclobenzaprine (FLEXERIL) 10 MG tablet; Take 1 tablet (10 mg total) by mouth 3 (three) times daily as needed for muscle spasms.  Dispense: 20 tablet; Refill: 0  4. [redacted] weeks gestation of pregnancy  Preterm labor symptoms and general obstetric precautions including but not limited to vaginal bleeding, contractions, leaking of fluid and fetal movement were reviewed in detail with the patient. Please refer to After Visit Summary for other counseling recommendations.   Return in about 2 weeks (around 11/25/2020) for LOB, In-Person.  Future Appointments  Date Time Provider Department Center  11/25/2020 10:35 AM [facility]borne [place]  12/12/2020  3:45 PM Filbert Schilder, NP [facility]  12/19/2020  7:45 AM [facility] NURSE [facility]  12/19/2020  8:00 AM [facility] US1 [facility]    Vonzella Nipple, PA-C

## 2020-11-11 NOTE — Progress Notes (Signed)
Lower back pain, rates it an 8

## 2020-11-12 LAB — CBC
Hematocrit: 36.2 % (ref 34.0–46.6)
Hemoglobin: 12 g/dL (ref 11.1–15.9)
MCH: 31.8 pg (ref 26.6–33.0)
MCHC: 33.1 g/dL (ref 31.5–35.7)
MCV: 96 fL (ref 79–97)
Platelets: 186 10*3/uL (ref 150–450)
RBC: 3.77 x10E6/uL (ref 3.77–5.28)
RDW: 12.3 % (ref 11.7–15.4)
WBC: 8.9 10*3/uL (ref 3.4–10.8)

## 2020-11-12 LAB — GLUCOSE TOLERANCE, 2 HOURS W/ 1HR
Glucose, 1 hour: 134 mg/dL (ref 65–179)
Glucose, 2 hour: 100 mg/dL (ref 65–152)
Glucose, Fasting: 78 mg/dL (ref 65–91)

## 2020-11-12 LAB — RPR: RPR Ser Ql: NONREACTIVE

## 2020-11-12 LAB — HIV ANTIBODY (ROUTINE TESTING W REFLEX): HIV Screen 4th Generation wRfx: NONREACTIVE

## 2020-11-18 ENCOUNTER — Other Ambulatory Visit: Payer: Self-pay

## 2020-11-18 ENCOUNTER — Inpatient Hospital Stay (HOSPITAL_COMMUNITY)
Admission: AD | Admit: 2020-11-18 | Discharge: 2020-11-18 | Disposition: A | Discharge status: Home / Self Care | Payer: Medicaid Other | Attending: Obstetrics & Gynecology | Admitting: Obstetrics & Gynecology

## 2020-11-18 ENCOUNTER — Encounter (HOSPITAL_COMMUNITY): Payer: Self-pay | Admitting: Obstetrics & Gynecology

## 2020-11-18 DIAGNOSIS — M79606 Pain in leg, unspecified: Secondary | ICD-10-CM | POA: Diagnosis not present

## 2020-11-18 DIAGNOSIS — O09293 Supervision of pregnancy with other poor reproductive or obstetric history, third trimester: Secondary | ICD-10-CM | POA: Insufficient documentation

## 2020-11-18 DIAGNOSIS — M549 Dorsalgia, unspecified: Secondary | ICD-10-CM | POA: Insufficient documentation

## 2020-11-18 DIAGNOSIS — Z3A28 28 weeks gestation of pregnancy: Secondary | ICD-10-CM | POA: Insufficient documentation

## 2020-11-18 DIAGNOSIS — Z79899 Other long term (current) drug therapy: Secondary | ICD-10-CM | POA: Diagnosis not present

## 2020-11-18 DIAGNOSIS — O09299 Supervision of pregnancy with other poor reproductive or obstetric history, unspecified trimester: Secondary | ICD-10-CM

## 2020-11-18 DIAGNOSIS — M5431 Sciatica, right side: Secondary | ICD-10-CM | POA: Diagnosis not present

## 2020-11-18 DIAGNOSIS — M79604 Pain in right leg: Secondary | ICD-10-CM | POA: Diagnosis not present

## 2020-11-18 DIAGNOSIS — O99891 Other specified diseases and conditions complicating pregnancy: Secondary | ICD-10-CM | POA: Insufficient documentation

## 2020-11-18 DIAGNOSIS — O26893 Other specified pregnancy related conditions, third trimester: Secondary | ICD-10-CM | POA: Diagnosis not present

## 2020-11-18 DIAGNOSIS — O09893 Supervision of other high risk pregnancies, third trimester: Secondary | ICD-10-CM

## 2020-11-18 DIAGNOSIS — Z8632 Personal history of gestational diabetes: Secondary | ICD-10-CM

## 2020-11-18 DIAGNOSIS — Z3689 Encounter for other specified antenatal screening: Secondary | ICD-10-CM | POA: Insufficient documentation

## 2020-11-18 DIAGNOSIS — O0990 Supervision of high risk pregnancy, unspecified, unspecified trimester: Secondary | ICD-10-CM

## 2020-11-18 LAB — URINALYSIS, ROUTINE W REFLEX MICROSCOPIC
Bilirubin Urine: NEGATIVE
Glucose, UA: NEGATIVE mg/dL
Hgb urine dipstick: NEGATIVE
Ketones, ur: NEGATIVE mg/dL
Leukocytes,Ua: NEGATIVE
Nitrite: NEGATIVE
Protein, ur: NEGATIVE mg/dL
Specific Gravity, Urine: 1.018 (ref 1.005–1.030)
pH: 6 (ref 5.0–8.0)

## 2020-11-18 NOTE — MAU Provider Note (Signed)
History     CSN: [id number]  Arrival date and time: 11/18/20 0943   Event Date/Time   First Provider Initiated Contact with Patient 11/18/20 1120      Chief Complaint  Patient presents with  . Fall   [name] is a 35 y.o. [phone] at [redacted]w[redacted]d who presents to MAU for right-sided back and leg pain. Patient reports about one month ago she was standing on her tip-toes changing her child's diaper and the child kicked her in the shoulders and knocked her back. Patient reports she did not fall, but she stumbled backwards awkwardly and since that time the right side of her back and the back of her right leg have been hurting. Patient denies issues or changes with urination or defecation, denies changes in strength, denies changes in sensation. Patient reports she is able to complete all of her daily activities, but with pain. She reports sometimes she gets a sharp, shooting pain down the back of her right leg with walking. Patient reports she was given an RX for Flexeril and told to take with Tylenol, but reports that does not help her at all. Patient reports the only things that help the pain temporarily are rest, massage, ice, heat, but reports the pain always comes back after these wear off. Patient has also been using Voltaren gel that she purchased, and reports that helps as well. Patient does not have any pregnancy concerns today.  Pt denies VB, LOF, ctx, decreased FM, vaginal discharge/odor/itching. Pt denies N/V, abdominal pain, constipation, diarrhea, or urinary problems. Pt denies fever, chills, fatigue, sweating or changes in appetite. Pt denies SOB or chest pain. Pt denies dizziness, HA, light-headedness, weakness.  Spanish interpreter used for entire visit.   OB History    Gravida  6   Para  4   Term  3   Preterm  1   AB  1   Living  3     SAB  1   IAB  0   Ectopic  0   Multiple  0   Live Births  4           Past Medical History:   Diagnosis Date  . Arrhythmia    occurs only in pregnancy, had evaluation in [place] with 5th pregnancy, normal evaluation per pt  . Gestational diabetes    preg #5  . Gestational hypertension   . Panic attacks     Past Surgical History:  Procedure Laterality Date  . NO PAST SURGERIES      Family History  Problem Relation Age of Onset  . Heart attack Father   . Heart disease Father   . Hypertension Paternal Grandmother   . Healthy Mother     Social History   Tobacco Use  . Smoking status: Never Smoker  . Smokeless tobacco: Never Used  Vaping Use  . Vaping Use: Never used  Substance Use Topics  . Alcohol use: Never  . Drug use: Never    Allergies: No Known Allergies  Medications Prior to Admission  Medication Sig Dispense Refill Last Dose  . cyclobenzaprine (FLEXERIL) 10 MG tablet Take 1 tablet (10 mg total) by mouth 3 (three) times daily as needed for muscle spasms. 20 tablet 0 Past Week at Unknown time  . Prenatal Vit-Fe Fumarate-FA (PRENATAL VITAMIN PO) Take by mouth.   11/18/2020 at Unknown time    Review of Systems  Constitutional: Negative for chills, diaphoresis, fatigue and fever.  Eyes: Negative for  visual disturbance.  Respiratory: Negative for shortness of breath.   Cardiovascular: Negative for chest pain.  Gastrointestinal: Negative for abdominal pain, constipation, diarrhea, nausea and vomiting.  Genitourinary: Negative for dysuria, flank pain, frequency, pelvic pain, urgency, vaginal bleeding and vaginal discharge.  Musculoskeletal: Positive for back pain.       Right leg pain  Neurological: Negative for dizziness, weakness, light-headedness and headaches.   Physical Exam   Blood pressure 122/76, pulse 89, temperature 98.7 F (37.1 C), height 5\' 1"  (1.549 m), weight 86 kg, last menstrual period 05/01/2020, SpO2 98 %.  Patient Vitals for the past 24 hrs:  BP Temp Pulse SpO2 Height Weight  11/18/20 1253 122/76 -- 89 -- -- --  11/18/20 1038 121/72  -- 100 -- -- --  11/18/20 1014 -- -- -- 98 % -- --  11/18/20 1013 118/74 98.7 F (37.1 C) (!) 107 -- -- --  11/18/20 1000 -- -- -- -- 5\' 1"  (1.549 m) 86 kg   Physical Exam Vitals and nursing note reviewed.  Constitutional:      General: She is not in acute distress.    Appearance: Normal appearance. She is not ill-appearing, toxic-appearing or diaphoretic.  HENT:     Head: Normocephalic and atraumatic.  Pulmonary:     Effort: Pulmonary effort is normal.  Musculoskeletal:     Comments: Tenderness along entire right-sided musculature of back from scapula to pelvis. No tenderness on left-hand side or over bony prominences. No issues with ROM. No differences in sensation between right and left lower extremities.  Skin:    General: Skin is warm and dry.  Neurological:     Mental Status: She is alert and oriented to person, place, and time.  Psychiatric:        Mood and Affect: Mood normal.        Behavior: Behavior normal.        Thought Content: Thought content normal.        Judgment: Judgment normal.    Results for orders placed or performed during the hospital encounter of 11/18/20 (from the past 24 hour(s))  Urinalysis, Routine w reflex microscopic Urine, Clean Catch     Status: Abnormal   Collection Time: 11/18/20 10:02 AM  Result Value Ref Range   Color, Urine YELLOW YELLOW   APPearance HAZY (A) CLEAR   Specific Gravity, Urine 1.018 1.005 - 1.030   pH 6.0 5.0 - 8.0   Glucose, UA NEGATIVE NEGATIVE mg/dL   Hgb urine dipstick NEGATIVE NEGATIVE   Bilirubin Urine NEGATIVE NEGATIVE   Ketones, ur NEGATIVE NEGATIVE mg/dL   Protein, ur NEGATIVE NEGATIVE mg/dL   Nitrite NEGATIVE NEGATIVE   Leukocytes,Ua NEGATIVE NEGATIVE   No results found.  MAU Course  Procedures  MDM -back pain and right leg pain x31mo -topicals provide temporary relief, oral medications do not assist -consulted with Dr. 11/20/20 in ED who does not believe imaging is necessary based on exam and patient  concerns, but agrees with plan for outpatient PT -spoke with Dr. 3mo regarding patient long-standing use of Voltaren and whether additional fetal imaging is warranted, reports it is not at this time, but pt should discontinue use -EFM: reactive       -baseline: 140/130       -variability: moderate       -accels: present, 10x10, 15x15       -decels: absent       -TOCO: quiet -pt discharged to home in stable condition  Orders Placed This Encounter  Procedures  . Urinalysis, Routine w reflex microscopic Urine, Clean Catch    Standing Status:   Standing    Number of Occurrences:   1  . Ambulatory referral to Physical Therapy    Referral Priority:   Routine    Referral Type:   Physical Medicine    Referral Reason:   Specialty Services Required    Requested Specialty:   Physical Therapy    Number of Visits Requested:   1  . Discharge patient    Order Specific Question:   Discharge disposition    Answer:   01-Home or Self Care [1]    Order Specific Question:   Discharge patient date    Answer:   11/18/2020   No orders of the defined types were placed in this encounter.  Assessment and Plan   1. Back pain in pregnancy   2. Supervision of high risk pregnancy, antepartum   3. History of gestational diabetes in prior pregnancy, currently pregnant   4. Right sciatic nerve pain   5. [redacted] weeks gestation of pregnancy   6. NST (non-stress test) reactive     Allergies as of 11/18/2020   No Known Allergies     Medication List    TAKE these medications   cyclobenzaprine 10 MG tablet Commonly known as: FLEXERIL Take 1 tablet (10 mg total) by mouth 3 (three) times daily as needed for muscle spasms.   PRENATAL VITAMIN PO Take by mouth.      -referral to PT -return MAU/ED precautions given -pt advised to discontinue Voltaren gel -pt discharged to home in stable condition  Odie Sera Brianah Hopson 11/18/2020, 12:57 PM

## 2020-11-18 NOTE — Discharge Instructions (Signed)
Dolor de Merchandiser, retail Back Pain in Pregnancy El dolor de espalda es habitual durante el [place]. Puede deberse a varios factores relacionados con los cambios durante esta etapa. Siga estas indicaciones en su casa: Control del dolor, el entumecimiento y la hinchazn  Si se lo indican, para el dolor de espalda repentino (agudo), aplique hielo en la zona dolorida. ? Ponga el hielo en una bolsa plstica. ? Coloque una [employer] piel y Copy. ? Coloque el hielo durante , 2a3veces al da.  Si se lo indican, aplique calor en la zona afectada antes de realizar ejercicios. Use la fuente de calor que el mdico le recomiende, como una compresa de calor hmedo o una almohadilla trmica. ? Coloque una [employer] piel y la fuente de Airline pilot. ? Aplique calor durante 20 a . ? Retire la fuente de calor si la piel se pone de color rojo brillante. Esto es especialmente importante si no puede sentir dolor, calor o fro. Puede correr un riesgo mayor de sufrir quemaduras.  Si se lo indican, aplique un masaje en la zona afectada.      Actividad  Haga ejercicio como se lo haya indicado el mdico. Hacer actividad fsica suave es la mejor forma de evitar o controlar el dolor de espalda.  Prstele atencin a su cuerpo cuando se levante. Si siente dolor al levantarse, pida ayuda o flexione las rodillas. [employer], se usan los msculos de las piernas en lugar de los de la espalda.  Pngase en cuclillas al levantar algo del suelo. No se agache.  Haga reposo en cama nicamente por perodos breves como se lo haya indicado el mdico. El reposo en cama solo debe hacerse cuando los episodios de dolor de espalda son ms intensos. Pararse, sentarse y acostarse  No permanezca sentada o de pie en el mismo lugar durante largos perodos.  Cuando est sentada, adopte una Education officer, museum. Asegrese de que su cabeza descanse sobre sus hombros y no est colgando hacia  delante. Use una almohada en la parte inferior de la espalda si es necesario.  Trate de dormir de lado, de preferencia del lado izquierdo, con una almohada de sostn para embarazadas o 1 o 2 almohadas comunes entre las piernas. ? Si tiene Engineer, mining de espalda despus de una noche de descanso, la cama puede ser [employer]. ? Un colchn duro puede brindarle ms apoyo para la Merchandiser, retail. Indicaciones generales  No use zapatos con tacones altos.  Siga una dieta saludable. Trate de aumentar de peso dentro de las recomendaciones del mdico.  Use una faja de maternidad, un arns elstico o un cors para la espalda como se lo haya indicado el mdico.  Tome los medicamentos de venta libre y los recetados solamente como se lo haya indicado el mdico.  Building control surveyor con un fisioterapeuta o un masajista para Engineer, manufacturing systems de Human resources officer de espalda. La acupuntura o la terapia de masajes pueden ser tiles.  Concurra a todas las visitas de control como se lo haya indicado el mdico. Esto es importante. Comunquese con un mdico si:  El dolor de Event organiser impide [employer] cotidianas.  Aumenta el dolor en otras partes del cuerpo. Solicite ayuda inmediatamente si:  Siente entumecimiento, hormigueo, debilidad o problemas con el uso de los brazos o las piernas.  Siente un dolor de espalda intenso que no puede controlar con los medicamentos.  Presenta modificaciones en el control de la vejiga o el intestino.  Siente que le falta el aire, se marea o se desmaya.  Tiene nuseas, vmitos o sudoracin.  Siente un dolor de espalda que es rtmico y de tipo clico, similar a las contracciones del [place]. Las contracciones del parto suelen aparecer cada 1 a , duran aproximadamente y estn acompaadas de una sensacin de empujar o de presin en la pelvis.  Tiene dolor de espalda y rompe la bolsa de las aguas o tiene sangrado vaginal.  El dolor o el  adormecimiento se extienden hacia la pierna.  El dolor aparece despus de una cada.  Siente dolor de un solo lado.  Observa sangre en la orina.  Le aparecen ampollas en la piel en la zona del dolor de espalda. Resumen  Puede deberse a varios factores relacionados con los cambios durante esta etapa.  Siga las indicaciones del mdico para Human resources officer, la rigidez y la hinchazn.  Haga ejercicio como se lo haya indicado el mdico. Hacer actividad fsica suave es la mejor forma de evitar o controlar el dolor de espalda.  Tome los medicamentos de venta libre y los recetados solamente como se lo haya indicado el mdico.  Oceanographer a todas las visitas de control como se lo haya indicado el mdico. Esto es importante. Esta informacin no tiene Theme park manager el consejo del mdico. Asegrese de hacerle al mdico cualquier pregunta que tenga. Document Revised: 03/03/2018 Document Reviewed: 03/03/2018 Elsevier Patient Education  2021 Elsevier Inc.        Parto prematuro Preterm Labor La duracin normal de un embarazo es de 39 a 41semanas. Se llama trabajo de parto prematuro cuando este se inicia antes de las 37semanas de [place]. Los bebs que nacen de forma prematura y sobreviven pueden no estar completamente desarrollados y correr un mayor riesgo de tener problemas a largo plazo, como parlisis cerebral, retrasos en el desarrollo y problemas de la vista y el odo. Los bebs que nacen antes de tiempo pueden tener problemas poco despus del nacimiento. Esos problemas pueden estar relacionados con la regulacin del azcar en la sangre, la temperatura corporal, la frecuencia cardaca y la frecuencia respiratoria. Estos bebs suelen tener problemas para alimentarse. El riesgo de tener problemas es mayor para los bebs que nacen antes de las 34semanas de Psychiatrist. Cules son las causas? Se desconoce la causa exacta de esta afeccin. Qu incrementa el riesgo? Es ms probable  que tenga un parto prematuro si tiene ciertos factores de riesgo que guardan relacin con sus antecedentes mdicos, problemas en el embarazo en curso y en los [place], y factores relacionados con el estilo de vida. Antecedentes mdicos  Tiene anormalidades en el tero, por ejemplo, el cuello uterino corto.  Tiene ITS (infecciones de transmisin sexual) u otras infecciones en las vas urinarias y la vagina.  Tiene enfermedades crnicas, como problemas de coagulacin sangunea, diabetes o hipertensin arterial.  Tiene sobrepeso o bajo peso. Embarazo en curso y Insurance underwriter anteriores  Ha tenido trabajo de parto prematuro anteriormente.  Tiene un embarazo gemelar o mltiple.  Le han diagnosticado una afeccin en la que la placenta cubre el cuello uterino (placenta previa).  Esper menos de 6 meses entre un parto y un Psychologist, occupational.  El beb en gestacin tiene algunas anormalidades.  Tiene sangrado vaginal durante el embarazo.  Qued embarazada a travs de la fertilizacin in vitro (FIV). Factores relacionados con el ambiente y el estilo de vida  Consume productos que contienen tabaco.  Bebe alcohol.  Consume drogas ilegales.  Tiene estrs y  no cuenta con apoyo social.  Sufre violencia domstica.  Est expuesta a ciertas sustancias qumicas o contaminantes ambientales. Otros factores  Es Adult nurse de 17aos o mayor de 35aos de edad. Cules son los signos o sntomas? Los sntomas de esta afeccin incluyen:  Educational psychologist similares a los que ocurren durante el perodo menstrual. Los calambres pueden presentarse con diarrea.  Dolor en el abdomen o en la parte inferior de la espalda.  Contracciones regulares que se pueden sentir como un endurecimiento del abdomen.  Una sensacin de mayor presin en la pelvis.  Aumento de la secrecin de mucosidad acuosa o sanguinolenta de la vagina.  Rotura de bolsa (rotura de saco amnitico). Cmo se diagnostica? Esta afeccin se  diagnostica en funcin de lo siguiente:  Los antecedentes mdicos y un examen fsico.  Un examen plvico.  Sherlyn Lees.  Monitorear el tero para Tree surgeon.  Otros estudios, incluidos los siguientes: ? Un hisopado del cuello uterino para detectar una sustancia qumica llamada fibronectina fetal. ? [place]. Cmo se trata? El tratamiento de [address] afeccin depende del tiempo de su [place], su estado y la salud de su beb. El tratamiento puede incluir:  Tomar medicamentos como, por ejemplo: ? Medicamentos hormonales. Estos se pueden administrar de forma temprana en el embarazo para ayudar a Visual merchandiser. ? Medicamentos para TEFL teacher las contracciones. ? Medicamentos que ayudan a [employer] del beb. Estos se pueden recetar si el riesgo de parto es [place]. ? Medicamentos para evitar que el beb desarrolle parlisis cerebral.  Reposo en cama. Si el trabajo de parto se inicia antes de las 34semanas de [place], es posible que deba hospitalizarse.  Llevar a cabo el parto. Siga estas instrucciones en su casa:  No consuma ningn producto que contenga nicotina o tabaco, como cigarrillos, cigarrillos electrnicos y tabaco de Theatre manager. Si necesita ayuda para dejar de consumir estos productos, consulte al mdico.  No beba alcohol.  Use los medicamentos de venta libre y los recetados solamente como se lo haya indicado el mdico.  Haga reposo como se lo haya indicado el mdico.  Retome sus actividades normales segn lo indicado por el mdico. Pregntele al mdico qu actividades son seguras para usted.  Concurra a todas las visitas de [address] se lo haya indicado el mdico. Esto es importante.   Cmo se previene? Para aumentar las probabilidades de tener un embarazo a trmino, Financial planner en cuenta lo siguiente:  No consuma drogas ni medicamentos que no le hayan recetado Academic librarian.  Hable con el mdico antes de tomar suplementos a base  de hierbas aunque los [employer].  Asegrese de llegar a un peso Office manager.  Tenga cuidado con las infecciones. Si cree que puede tener una infeccin, consulte al mdico para que la revisen. Los sntomas de infeccin pueden incluir los siguientes: ? [place]. ? Secrecin vaginal anormal o con mal olor. ? Ardor o dolor al [employer]. ? Necesidad urgente de Geographical information systems officer. ? Miccin frecuente u orinar pequeas cantidades con frecuencia. ? [employer]. ? Orina con mal olor u [employer].  Infrmele al mdico si ha tenido un trabajo de parto prematuro antes. Comunquese con un mdico si:  Piensa que est teniendo un trabajo de parto prematuro.  Tiene signos o sntomas de trabajo de [employer].  Tiene sntomas de infeccin. Solicite ayuda de inmediato si:  Tiene contracciones dolorosas y regulares cada 5 minutos o menos.  Rompe la bolsa. Resumen  Se llama  trabajo de parto prematuro cuando se inicia antes de las 37 semanas de [place].  El parto prematuro aumenta el riesgo del beb de desarrollar problemas de por vida.  No se conoce la causa exacta del trabajo de parto prematuro. Sin embargo, tener un tero anormal, una ITS (infeccin de transmisin sexual) o sangrado vaginal durante el embarazo aumenta el riesgo de [place].  Concurra a todas las visitas de [address] se lo haya indicado el mdico. Esto es importante.  Comunquese con un mdico si tiene signos o sntomas de trabajo de [employer]. Esta informacin no tiene Theme park manager el consejo del mdico. Asegrese de hacerle al mdico cualquier pregunta que tenga. Document Revised: 11/02/2019 Document Reviewed: 11/02/2019 Elsevier Patient Education  2021 [employer].

## 2020-11-18 NOTE — MAU Note (Signed)
About a month ago, pt was changing son's diaper, he kicked at her and she fell back.  Has seen her dr and was given rx, states it is not helping.

## 2020-11-21 ENCOUNTER — Other Ambulatory Visit: Payer: Self-pay

## 2020-11-21 ENCOUNTER — Inpatient Hospital Stay (HOSPITAL_COMMUNITY)
Admission: AD | Admit: 2020-11-21 | Discharge: 2020-11-21 | Disposition: A | Discharge status: Home / Self Care | Payer: Medicaid Other | Attending: Family Medicine | Admitting: Family Medicine

## 2020-11-21 ENCOUNTER — Encounter (HOSPITAL_COMMUNITY): Payer: Self-pay | Admitting: Family Medicine

## 2020-11-21 ENCOUNTER — Inpatient Hospital Stay (HOSPITAL_COMMUNITY): Payer: Medicaid Other

## 2020-11-21 DIAGNOSIS — O99891 Other specified diseases and conditions complicating pregnancy: Secondary | ICD-10-CM

## 2020-11-21 DIAGNOSIS — M545 Low back pain, unspecified: Secondary | ICD-10-CM | POA: Diagnosis not present

## 2020-11-21 DIAGNOSIS — M25559 Pain in unspecified hip: Secondary | ICD-10-CM | POA: Diagnosis not present

## 2020-11-21 DIAGNOSIS — M549 Dorsalgia, unspecified: Secondary | ICD-10-CM | POA: Diagnosis not present

## 2020-11-21 DIAGNOSIS — O26893 Other specified pregnancy related conditions, third trimester: Secondary | ICD-10-CM | POA: Diagnosis not present

## 2020-11-21 DIAGNOSIS — Z79899 Other long term (current) drug therapy: Secondary | ICD-10-CM | POA: Diagnosis not present

## 2020-11-21 DIAGNOSIS — Z3A29 29 weeks gestation of pregnancy: Secondary | ICD-10-CM | POA: Diagnosis not present

## 2020-11-21 MED ORDER — OXYCODONE-ACETAMINOPHEN 5-325 MG PO TABS
1.5000 | ORAL_TABLET | Freq: Once | ORAL | Status: AC
  Administered 2020-11-21: 1.5 via ORAL
  Filled 2020-11-21: qty 2

## 2020-11-21 MED ORDER — CYCLOBENZAPRINE HCL 5 MG PO TABS: 10.0000 mg | ORAL_TABLET | Freq: Once | ORAL | Status: DC

## 2020-11-21 MED ORDER — CYCLOBENZAPRINE HCL 5 MG PO TABS: 5.0000 mg | ORAL_TABLET | Freq: Three times a day (TID) | ORAL | 0 refills | Status: DC | PRN

## 2020-11-21 MED ORDER — OXYCODONE HCL 5 MG PO TABS
5.0000 mg | ORAL_TABLET | Freq: Four times a day (QID) | ORAL | 0 refills | Status: AC | PRN
Start: 2020-11-21 — End: 2020-11-24

## 2020-11-21 NOTE — MAU Provider Note (Addendum)
History     CSN: [id number]  Arrival date and time: 11/21/20 1550   Event Date/Time   First Provider Initiated Contact with Patient 11/21/20 1648      Chief Complaint  Patient presents with  . Back Pain  . Hip Pain   HPI This is a 35yo [id number] at [redacted]w[redacted]d who presents with back pain that started over a month ago after falling. Pain constant and in right lower back with radiation down into buttocks. Pain worsened with movements, improved with rest. She feels like her leg is going to give out on her frequently when she stands. Currently, her activity level has been limited to standing for brief periods and minimal amount of walking. No numbness or tingling in legs. Has been taking flexeril, which doesn't help with the pain, but just makes her sleepy. Pain is worsening.  OB History    Gravida  6   Para  4   Term  3   Preterm  1   AB  1   Living  3     SAB  1   IAB  0   Ectopic  0   Multiple  0   Live Births  4           Past Medical History:  Diagnosis Date  . Arrhythmia    occurs only in pregnancy, had evaluation in [place] with 5th pregnancy, normal evaluation per pt  . Gestational diabetes    preg #5  . Gestational hypertension   . Panic attacks     Past Surgical History:  Procedure Laterality Date  . NO PAST SURGERIES      Family History  Problem Relation Age of Onset  . Heart attack Father   . Heart disease Father   . Hypertension Paternal Grandmother   . Healthy Mother     Social History   Tobacco Use  . Smoking status: Never Smoker  . Smokeless tobacco: Never Used  Vaping Use  . Vaping Use: Never used  Substance Use Topics  . Alcohol use: Never  . Drug use: Never    Allergies: No Known Allergies  Medications Prior to Admission  Medication Sig Dispense Refill Last Dose  . Prenatal Vit-Fe Fumarate-FA (PRENATAL VITAMIN PO) Take by mouth.   11/20/2020 at Unknown time  . cyclobenzaprine (FLEXERIL) 10 MG tablet Take 1 tablet (10 mg  total) by mouth 3 (three) times daily as needed for muscle spasms. 20 tablet 0     Review of Systems Physical Exam   Blood pressure 135/84, pulse 98, temperature 98 F (36.7 C), temperature source Oral, resp. rate 20, last menstrual period 05/01/2020, SpO2 98 %.  Physical Exam Vitals and nursing note reviewed.  Constitutional:      Appearance: Normal appearance.  HENT:     Head: Normocephalic and atraumatic.  Cardiovascular:     Rate and Rhythm: Normal rate and regular rhythm.  Pulmonary:     Effort: Pulmonary effort is normal.     Breath sounds: Normal breath sounds.  Abdominal:     General: Abdomen is flat.     Palpations: Abdomen is soft.  Musculoskeletal:       Arms:     Lumbar back: Spasms (right lumbar) present. No swelling, deformity or lacerations.  Skin:    Capillary Refill: Capillary refill takes less than 2 seconds.  Neurological:     General: No focal deficit present.     Mental Status: She is alert.     Cranial  Nerves: No cranial nerve deficit.     Motor: No weakness.     Comments: Positive straight leg raise. Pain with external rotation of hip  Psychiatric:        Mood and Affect: Mood normal.        Behavior: Behavior normal.        Thought Content: Thought content normal.        Judgment: Judgment normal.     MAU Course  Procedures MR LUMBAR SPINE WO CONTRAST  Result Date: 11/21/2020 CLINICAL DATA:  Low back pain.  Pregnant. EXAM: MRI LUMBAR SPINE WITHOUT CONTRAST TECHNIQUE: Multiplanar, multisequence MR imaging of the lumbar spine was performed. No intravenous contrast was administered. COMPARISON:  None. FINDINGS: Segmentation:  Normal Alignment:  Normal Vertebrae: Normal bone marrow. Negative for fracture or mass. 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 Conus medullaris and cauda equina: Conus extends to the L1-2 level. Conus and cauda equina appear normal. Paraspinal and other soft tissues: Gravid uterus. Fetus in vertex  presentation. Disc levels: L1-2: Negative L2-3: Negative L3-4: Negative L4-5: Negative L5-S1: Mild disc bulging. Negative for neural impingement or stenosis. IMPRESSION: Negative for neural impingement or stenosis. Gravid uterus.  Fetus in vertex presentation. Electronically Signed   By: Marlan Palau M.D.   On: 11/21/2020 19:34    MDM As pain increasing and been on going for close to 6 weeks, will get MRI.  Care turned over to Judeth Horn, NP.  Levie Heritage, DO 11/21/2020 at 1740    Patient returned from MRI. Reports some improvement in pain when lying down but still has significant pain with position changes.  Reviewed MRI results with Dr. Adrian Blackwater who recommends pain management & referral to physical therapy.    NST:  Baseline: 145 bpm, Variability: Good {> 6 bpm), Accelerations: Reactive and Decelerations: Absent  Assessment and Plan   1. Back pain affecting pregnancy in third trimester   2. [redacted] weeks gestation of pregnancy    -refilled flexeril -rx oxycodone #12 - discussed CNS side effects, risk for constipation, & abuse potential -take tylenol prn -referral to PT resent to appropriate location & patient given contact information  Judeth Horn, NP

## 2020-11-21 NOTE — MAU Note (Signed)
Pain that she was seen for on Friday is worse.  Pt tearful in triage, can hardly walk.  (hx: about a month ago she was changing her son - he is a large, 33 yr old with autism, he kicked out at her and she fell back).  She has run out of her medication.  Called office today, they could not see her and told her to come here. Denies bleeding or leaking. Pain is on rt side, radiating down to mid thight at times. Feels really tight like it is twisted.

## 2020-11-21 NOTE — Discharge Instructions (Signed)
Dolor de Merchandiser, retail Back Pain in Pregnancy El dolor de espalda es habitual durante el [place]. Puede deberse a varios factores relacionados con los cambios durante esta etapa. Siga estas indicaciones en su casa: Control del dolor, el entumecimiento y la hinchazn  Si se lo indican, para el dolor de espalda repentino (agudo), aplique hielo en la zona dolorida. ? Ponga el hielo en una bolsa plstica. ? Coloque una [employer] piel y Copy. ? Coloque el hielo durante , 2a3veces al da.  Si se lo indican, aplique calor en la zona afectada antes de realizar ejercicios. Use la fuente de calor que el mdico le recomiende, como una compresa de calor hmedo o una almohadilla trmica. ? Coloque una [employer] piel y la fuente de Airline pilot. ? Aplique calor durante 20 a . ? Retire la fuente de calor si la piel se pone de color rojo brillante. Esto es especialmente importante si no puede sentir dolor, calor o fro. Puede correr un riesgo mayor de sufrir quemaduras.  Si se lo indican, aplique un masaje en la zona afectada.      Actividad  Haga ejercicio como se lo haya indicado el mdico. Hacer actividad fsica suave es la mejor forma de evitar o controlar el dolor de espalda.  Prstele atencin a su cuerpo cuando se levante. Si siente dolor al levantarse, pida ayuda o flexione las rodillas. [employer], se usan los msculos de las piernas en lugar de los de la espalda.  Pngase en cuclillas al levantar algo del suelo. No se agache.  Haga reposo en cama nicamente por perodos breves como se lo haya indicado el mdico. El reposo en cama solo debe hacerse cuando los episodios de dolor de espalda son ms intensos. Pararse, sentarse y acostarse  No permanezca sentada o de pie en el mismo lugar durante largos perodos.  Cuando est sentada, adopte una Education officer, museum. Asegrese de que su cabeza descanse sobre sus hombros y no est colgando hacia  delante. Use una almohada en la parte inferior de la espalda si es necesario.  Trate de dormir de lado, de preferencia del lado izquierdo, con una almohada de sostn para embarazadas o 1 o 2 almohadas comunes entre las piernas. ? Si tiene Engineer, mining de espalda despus de una noche de descanso, la cama puede ser [employer]. ? Un colchn duro puede brindarle ms apoyo para la Merchandiser, retail. Indicaciones generales  No use zapatos con tacones altos.  Siga una dieta saludable. Trate de aumentar de peso dentro de las recomendaciones del mdico.  Use una faja de maternidad, un arns elstico o un cors para la espalda como se lo haya indicado el mdico.  Tome los medicamentos de venta libre y los recetados solamente como se lo haya indicado el mdico.  Building control surveyor con un fisioterapeuta o un masajista para Engineer, manufacturing systems de Human resources officer de espalda. La acupuntura o la terapia de masajes pueden ser tiles.  Concurra a todas las visitas de control como se lo haya indicado el mdico. Esto es importante. Comunquese con un mdico si:  El dolor de Event organiser impide [employer] cotidianas.  Aumenta el dolor en otras partes del cuerpo. Solicite ayuda inmediatamente si:  Siente entumecimiento, hormigueo, debilidad o problemas con el uso de los brazos o las piernas.  Siente un dolor de espalda intenso que no puede controlar con los medicamentos.  Presenta modificaciones en el control de la vejiga o el intestino.  Siente que le falta el aire, se marea o se desmaya.  Tiene nuseas, vmitos o sudoracin.  Siente un dolor de espalda que es rtmico y de tipo clico, similar a las contracciones del [place]. Las contracciones del parto suelen aparecer cada 1 a , duran aproximadamente y estn acompaadas de una sensacin de empujar o de presin en la pelvis.  Tiene dolor de espalda y rompe la bolsa de las aguas o tiene sangrado vaginal.  El dolor o el  adormecimiento se extienden hacia la pierna.  El dolor aparece despus de una cada.  Siente dolor de un solo lado.  Observa sangre en la orina.  Le aparecen ampollas en la piel en la zona del dolor de espalda. Resumen  Puede deberse a varios factores relacionados con los cambios durante esta etapa.  Siga las indicaciones del mdico para Human resources officer, la rigidez y la hinchazn.  Haga ejercicio como se lo haya indicado el mdico. Hacer actividad fsica suave es la mejor forma de evitar o controlar el dolor de espalda.  Tome los medicamentos de venta libre y los recetados solamente como se lo haya indicado el mdico.  Oceanographer a todas las visitas de control como se lo haya indicado el mdico. Esto es importante. Esta informacin no tiene Theme park manager el consejo del mdico. Asegrese de hacerle al mdico cualquier pregunta que tenga. Document Revised: 03/03/2018 Document Reviewed: 03/03/2018 Elsevier Patient Education  2021 [employer].

## 2020-11-21 NOTE — MAU Note (Signed)
Pt taken to rm in WC. Assisted pt to get undressed and into gown/bed.  Pt could hardly lift legs without significant pain.

## 2020-11-21 NOTE — Progress Notes (Signed)
Transporter in attendance at bedside to take patient to MRI via stretcher. Husband accomapined.

## 2020-11-25 ENCOUNTER — Other Ambulatory Visit: Payer: Self-pay

## 2020-11-25 ENCOUNTER — Ambulatory Visit (INDEPENDENT_AMBULATORY_CARE_PROVIDER_SITE_OTHER): Payer: Self-pay | Admitting: Certified Nurse Midwife

## 2020-11-25 VITALS — BP 129/90 | HR 128 | Wt 185.0 lb

## 2020-11-25 DIAGNOSIS — M549 Dorsalgia, unspecified: Secondary | ICD-10-CM

## 2020-11-25 DIAGNOSIS — O99891 Other specified diseases and conditions complicating pregnancy: Secondary | ICD-10-CM

## 2020-11-25 DIAGNOSIS — Z3493 Encounter for supervision of normal pregnancy, unspecified, third trimester: Secondary | ICD-10-CM

## 2020-11-25 DIAGNOSIS — Z3A29 29 weeks gestation of pregnancy: Secondary | ICD-10-CM

## 2020-11-25 NOTE — Progress Notes (Signed)
   PRENATAL VISIT NOTE  Subjective:  Alyssa Blackwell is a 35 y.o. [phone] at [redacted]w[redacted]d being seen today for ongoing prenatal care.  She is currently monitored for the following issues for this high-risk pregnancy and has Supervision of high risk pregnancy, antepartum; History of gestational diabetes in prior pregnancy, currently pregnant; Vulvovaginitis; Language barrier; and Dental abscess on their problem list.  Patient reports intense mid-back and sciatic pain. Has been to MAU twice for this pain, given flexeril and oxycodone, is now out of oxycodone.  Contractions: Not present.  .  Movement: Present. Denies leaking of fluid.   The following portions of the patient's history were reviewed and updated as appropriate: allergies, current medications, past family history, past medical history, past social history, past surgical history and problem list.   Objective:   Vitals:   11/25/20 1052  BP: 129/90  Pulse: (!) 128  Weight: 185 lb (83.9 kg)    Fetal Status: Fetal Heart Rate (bpm): 135 Fundal Height: 24 cm Movement: Present     General:  Alert, oriented and cooperative. Patient is in no acute distress.  Skin: Skin is warm and dry. No rash noted.   Cardiovascular: Normal heart rate noted  Respiratory: Normal respiratory effort, no problems with respiration noted  Abdomen: Soft, gravid, appropriate for gestational age.  Pain/Pressure: Present     Pelvic: Cervical exam deferred        Extremities: Normal range of motion.  Edema: None  Mental Status: Normal mood and affect. Normal behavior. Normal judgment and thought content.   Assessment and Plan:  Pregnancy: [id number] at [redacted]w[redacted]d 1. Supervision of low-risk pregnancy, third trimester - Doing well other than back and sciatic pain  2. [redacted] weeks gestation of pregnancy - Routine OB care  3. Back pain affecting pregnancy in third trimester - Advised stretching, epsom salt soaks, heating pad will work better in the long run than  continuing opiate use. Encouraged use of flexeril at night and Tylenol through the day - Suggested chiropractic visit with Daron Offer, referral sent directly to Dr. Aileen Pilot, pt plans to call and use either 13yo son or the interpretation app on her phone - Demonstrated stretches to relieve mid-back and sciatic pain - Ambulatory referral to Physical Therapy  Preterm labor symptoms and general obstetric precautions including but not limited to vaginal bleeding, contractions, leaking of fluid and fetal movement were reviewed in detail with the patient. Please refer to After Visit Summary for other counseling recommendations.   Return in about 2 weeks (around 12/09/2020) for LOB, IN-PERSON.  Future Appointments  Date Time Provider Department Center  12/12/2020  3:45 PM Filbert Schilder, NP [facility]  12/19/2020  7:45 AM [facility] N[facility]  12/19/2020  8:00 AM [facility] US1 [facility]    Bernerd Limbo, CNM

## 2020-11-25 NOTE — Patient Instructions (Addendum)
Daron OfferLiana Hastings w/ [facility] [address] [place], [place] [id number] [phone] [email or web address]    Tercer trimestre de embarazo Third Trimester of Pregnancy  El tercer trimestre de embarazo va desde la semana 28 hasta la semana 40. Tambin se dice que va desde el mes 7 hasta el mes 9. En este trimestre, el beb en gestacin (feto) crece muy rpidamente. Hacia el final del noveno mes, el beb en gestacin mide alrededor de 20pulgadas (45cm) de largo. Pesa entre 6y 10libras [phone](2,70y 4,50kg). Cambios en el cuerpo durante el tercer trimestre Su organismo contina atravesando por muchos cambios durante este perodo. Los cambios varan y generalmente vuelven a la normalidad despus del nacimiento del beb. Cambios fsicos  Seguir American Standard Companiesaumentando de peso. Puede ser que Ruskaumente entre 25 y 35libras (11 y 16kg) hacia el final del Psychiatristembarazo. Si tiene Sanmina-SCIbajo peso, puede aumentar entre 28 y 40lb (unos 13 a 18kg). Si tiene sobrepeso, puede aumentar entre 15 y 25 libras (unos 7 a 11kg).  Podrn aparecer las primeras estras en las caderas, el vientre (abdomen) y las [place]mamas.  Las ConAgra Foodsmamas seguirn creciendo y Writerpueden doler. Un lquido amarillo Charity fundraiser(calostro) puede salir de sus pechos. Esta es la primera leche que usted produce para el beb.  Tal vez haya cambios en el cabello.  El ombligo puede salir hacia afuera.  Puede observar que se le hinchan ms las [address], la cara o los tobillos. Cambios en la salud  Es posible que tenga acidez estomacal.  Es posible que tenga dificultades para defecar (estreimiento).  Pueden aparecerle hemorroides. Estas son venas hinchadas en el ano que pueden picar o doler.  Puede comenzar a tener venas hinchadas (vrices) en las piernas.  Puede presentar ms dolor en la pelvis, la espalda o los muslos.  Puede presentar ms hormigueo o entumecimiento en las manos, los brazos y las  piernas. La piel de su vientre tambin puede sentirse entumecida.  Es posible que sienta falta de aire a medida que el tero se Italyagranda. Otros cambios  Es posible que haga pis (orine) con mayor frecuencia.  Puede tener ms problemas para dormir.  Puede notar que el beb en gestacin "baja" o se mueve ms hacia bajo, en el vientre.  Puede notar ms secrecin proveniente de la vagina.  Puede sentir las articulaciones flojas y Production assistant, radiopuede sentir dolor alrededor del hueso plvico. Siga estas instrucciones en su casa: Medicamentos  Use los medicamentos de venta libre y los recetados solamente como se lo haya indicado el mdico. Algunos medicamentos no son seguros Academic librariandurante el embarazo.  Tome vitaminas prenatales que contengan por lo menos 600microgramos (mcg) de cido flico. Comida y bebida  Consuma comidas saludables que incluyan lo siguiente: ? Nils PyleFrutas y verduras frescas. ? Cereales integrales. ? Buenas fuentes de protenas, como carne, huevos y tofu. ? Productos lcteos con bajo contenido de grasa.  Evite la carne cruda y el Lindalejugo, la Ocean [place] Beachleche y el queso sin Market researcherpasteurizar. Estos portan grmenes que pueden provocar dao tanto a usted como al beb.  Tome 4 o 5 comidas pequeas en lugar de 3 comidas abundantes al da.  Es posible que deba tomar medidas para prevenir o tratar los problemas para defecar: ? Product managerBeber suficiente lquido para Radio producermantener el pis (orina) de color amarillo plido. ? Come alimentos ricos en fibra. Entre ellos, frijoles, cereales integrales y frutas y verduras frescas. ? Limitar los alimentos con alto contenido de grasa y International aid/development workerazcar. Estos incluyen alimentos fritos o dulces. Actividad  Haga ejercicios solamente como se lo haya indicado el mdico. Interrumpa la actividad fsica si comienza a tener clicos en el tero.  Evite levantar pesos Fortune Brands.  No haga ejercicio si hace demasiado calor, hay demasiada humedad o se encuentra en un lugar de mucha altura (altitud  elevada).  Si lo desea, puede continuar teniendo [employer] St. [employer] Travelers, a menos que el mdico le indique lo contrario. Alivio del dolor y del malestar  Haga pausas con frecuencia y descanse con las piernas levantadas (elevadas) si tiene calambres en las piernas o dolor en la parte baja de la espalda.  Dese baos de asiento con agua tibia para Engineer, materials o las molestias causadas por las hemorroides. Use una crema para las hemorroides si el mdico la autoriza.  Use un sostn que le brinde buen soporte si sus mamas estn sensibles.  Si desarrolla venas hinchadas y abultadas en las piernas: ? Use medias de compresin segn las indicaciones de su mdico. ? Levante los pies durante , 3 o 4veces por Futures trader. ? Limite la sal en sus alimentos. Seguridad  Hable con el mdico antes de Editor, commissioning.  No se d baos de inmersin en agua caliente, baos turcos ni saunas.  Use el cinturn de seguridad en todo momento mientras vaya en auto.  Hable con el mdico si alguien le est haciendo dao o gritando [place]. Preparacin para la llegada del beb Para prepararse para la llegada de su beb:  Tome clases prenatales.  Visite el hospital y recorra el rea de maternidad.  Compre un asiento de seguridad TRW [employer]otive atrs para llevar al beb en el automvil. Aprenda cmo instalarlo en el auto.  Prepare la habitacin del beb. Saque todas las almohadas y los animales de peluche de la cuna del beb. Instrucciones generales  Evite el contacto con las bandejas sanitarias de los gatos y la tierra que estos animales usan. Estos contienen grmenes que pueden daar al beb y causar la prdida del beb ya sea aborto espontneo o muerte fetal.  No se haga duchas vaginales ni use tampones. No use tampones ni toallas higinicas perfumadas.  No fume ni consuma ningn producto que contenga nicotina o tabaco. Si necesita ayuda para dejar de fumar, consulte al mdico.  No beba  alcohol.  No use medicamentos a base de hierbas, drogas ilegales, ni medicamentos que el mdico no haya autorizado. Las sustancias qumicas de estos productos pueden afectar al beb.  Cumpla con todas las visitas de seguimiento. Esto es importante. Dnde buscar ms informacin  American Pregnancy Association (Asociacin Americana del Embarazo): americanpregnancy.org  Celanese Corporation of Obstetricians and Gynecologists (Colegio Estadounidense de Obstetras y Gineclogos): www.acog.org  Office on Pitney Bowes (Oficina para la Salud de la Mujer): MightyReward.co.nz Comunquese con un mdico si:  Tiene fiebre.  Tiene clicos leves o siente presin en la parte baja del vientre.  Sufre un dolor persistente en el abdomen.  Vomita o hace deposiciones acuosas (diarrea).  Advierte lquido con mal olor que proviene de la vagina.  Siente dolor al orinar o hace orina con mal olor.  Tiene un dolor de cabeza que no desaparece despus de Science writer.  Nota cambios en la visin o ve manchas delante de los ojos. Solicite ayuda de inmediato si:  Rompe la bolsa.  Tiene contracciones regulares separadas por menos de .  Tiene sangrado o pequeas prdidas vaginales.  Tiene clicos o dolor muy intensos en el vientre.  Tiene dificultad para respirar.  Sientes dolor en el pecho.  Se desmaya.  No ha sentido al beb moverse durante el tiempo que le indic el mdico.  Tiene dolor, hinchazn o enrojecimiento nuevos en un brazo o una pierna o se produce un aumento de alguno de estos sntomas. Resumen  El tercer trimestre comprende desde la General Motors la semana40 (desde el mes7 hasta el mes9). Esta es la poca en que el beb en gestacin crece muy rpidamente.  Durante este perodo, las [address] a medida que usted sube de peso y el beb crece.  Preprese para la llegada del beb: asista a las clases prenatales, compre un asiento de seguridad  orientado hacia atrs para llevar al beb en auto y prepare la habitacin del beb.  Solicite ayuda de inmediato si tiene sangrado por la vagina, siente dolor en el pecho y tiene dificultad para respirar, o si no ha sentido al beb moverse durante el tiempo que le indic el mdico. Esta informacin no tiene Theme park manager el consejo del mdico. Asegrese de hacerle al mdico cualquier pregunta que tenga. Document Revised: 02/03/2020 Document Reviewed: 02/03/2020 Elsevier Patient Education  2021 Elsevier Inc. Dolor de espalda agudo en los adultos Acute Back Pain, Adult El dolor de espalda agudo es repentino y por lo general no dura mucho tiempo. Se debe generalmente a una lesin de los msculos y tejidos de la espalda. La lesin puede ser el resultado de:  Estiramiento en exceso o desgarro (esguince) de un msculo o ligamento. Los ligamentos son tejidos que Owens & Minor. Levantar algo de forma incorrecta puede producir un esguince de espalda.  Desgaste (degeneracin) de los discos vertebrales. Los discos vertebrales son tejidos circulares que proporcionan amortiguacin entre los huesos de la columna vertebral (vrtebras).  Movimientos de giro, como al practicar deportes o realizar trabajos de [place].  Un golpe en la espalda.  Artritis. Es posible Producer, television/film/video un examen fsico, anlisis de laboratorio u otros estudios de diagnstico por imgenes para Veterinary surgeon causa del Engineer, mining. El dolor de espalda agudo generalmente desaparece con reposo y cuidados en la casa. Siga estas instrucciones en su casa: Control del dolor, la rigidez y la hinchazn  El tratamiento puede incluir medicamentos para Chief Technology Officer y la inflamacin que se toman por boca o que se aplican sobre la piel, analgsicos recetados o relajantes musculares. Use los medicamentos de venta libre y los recetados solamente como se lo haya indicado el mdico.  El mdico puede recomendarle que se aplique hielo durante las  primeras 24a 48horas despus del comienzo del Engineer, mining. Para hacer esto: ? Ponga el hielo en una bolsa plstica. ? Coloque una FirstEnergy Corp piel y Copy. ? Aplique el hielo durante , 2 a 3veces por da.  Si se lo indican, aplique calor en la zona afectada con la frecuencia que le haya indicado el mdico. Use la fuente de calor que el mdico le recomiende, como una compresa de calor hmedo o una almohadilla trmica. ? Coloque una FirstEnergy Corp piel y la fuente de Airline pilot. ? Aplique calor durante 20 a . ? Retire la fuente de calor si la piel se pone de color rojo brillante. Esto es especialmente importante si no puede sentir dolor, calor o fro. Corre un mayor riesgo de sufrir quemaduras. Actividad  No permanezca en la cama. Hacer reposo en la cama por ms de 1 a 2 das puede demorar su recuperacin.  Mantenga una buena postura al sentarse y pararse. No se incline hacia adelante al sentarse ni se  encorve al pararse. ? Si trabaja en un escritorio, sintese cerca de este para no tener que inclinarse. Mantenga el mentn hacia abajo. Mantenga el cuello hacia atrs y los codos flexionados en un ngulo de 90 grados (ngulo recto). ? Cuando conduzca, sintese elevado y cerca del volante. Agregue un apoyo para la espalda (lumbar) al asiento del automvil, si es necesario.  Realice caminatas cortas en superficies planas tan pronto como le sea posible. Trate de caminar un poco ms de Pharmacist, community.  No se siente, conduzca o permanezca de pie en un mismo lugar durante ms de 30 minutos seguidos. Pararse o sentarse durante largos perodos de Contractor la espalda.  No conduzca ni use maquinaria pesada mientras toma analgsicos recetados.  Use tcnicas apropiadas para levantar objetos. Cuando se inclina y Solicitor un [place], utilice posiciones que no sobrecarguen tanto la espalda: ? Flexione las rodillas. ? Mantenga la carga cerca del cuerpo. ? No se  tuerza.  Haga actividad fsica habitualmente como se lo haya indicado el mdico. Hacer ejercicios ayuda a que la espalda sane ms rpido y [place] a Automotive engineer las lesiones de la espalda al [employer] msculos fuertes y flexibles.  Trabaje con un fisioterapeuta para crear un programa de ejercicios seguros, segn lo recomiende el mdico. Haga ejercicios como se lo haya indicado el fisioterapeuta.   Estilo de vida  Mantenga un peso saludable. El sobrepeso sobrecarga la espalda y hace que resulte difcil tener una buena [place].  Evite actividades o situaciones que lo hagan sentirse ansioso o estresado. El estrs y la ansiedad aumentan la tensin muscular y pueden empeorar el dolor de espalda. Aprenda formas de McGraw-Hill ansiedad y [place], como a travs del ejercicio. Instrucciones generales  Duerma sobre un colchn firme en una posicin cmoda. Intente acostarse de costado, con las rodillas ligeramente flexionadas. Si se recuesta Fisher Scientific, coloque una almohada debajo de las rodillas.  Siga el plan de tratamiento como se lo haya indicado el mdico. Esto puede incluir: ? Terapia cognitiva o conductual. ? Acupuntura o terapia de masajes. ? Yoga o meditacin. Comunquese con un mdico si:  Siente un dolor que no se alivia con reposo o medicamentos.  Siente mucho dolor que se extiende a las piernas o las nalgas.  El dolor no mejora luego de 2 semanas.  Siente dolor por la noche.  Pierde peso sin proponrselo.  Tiene fiebre o escalofros. Solicite ayuda de inmediato si:  Tiene nuevos problemas para controlar la vejiga o los intestinos.  Siente debilidad o adormecimiento inusuales en los brazos o en las piernas.  Siente nuseas o vmitos.  Siente dolor abdominal.  Siente que va a desmayarse. Resumen  El dolor de espalda agudo es repentino y por lo general no dura mucho tiempo.  Use tcnicas apropiadas para levantar objetos. Cuando se inclina y levanta un [place], utilice  posiciones que no sobrecarguen tanto la espalda.  Tome los medicamentos de venta libre o recetados y aplique calor o hielo solamente como se lo haya indicado el mdico. Esta informacin no tiene Theme park manager el consejo del mdico. Asegrese de hacerle al mdico cualquier pregunta que tenga. Document Revised: 05/12/2020 Document Reviewed: 05/12/2020 Elsevier Patient Education  2021 ArvinMeritor.

## 2020-12-04 DIAGNOSIS — Z419 Encounter for procedure for purposes other than remedying health state, unspecified: Secondary | ICD-10-CM | POA: Diagnosis not present

## 2020-12-11 NOTE — Progress Notes (Signed)
Cardiology Office Note   Date:  12/12/2020   ID:  Alyssa Blackwell, DOB 1986-05-29, MRN [id number]  PCP:  Catalina Antigua, MD  Cardiologist:  Dr. Eden Emms, MD   Chief Complaint  Patient presents with  . Palpitations    History of Present Illness: Alyssa Blackwell is a 35 y.o. female who presents for follow-up for tachypalpitations, seen for Dr. Eden Emms.   Alyssa Blackwell was referred to cardiology from her OB/GYN for the evaluation of tachypalpitations which began around 20 weeks during her previous pregnancies. Patient is a G [id number] currently pregnant at [redacted] weeks.   On last office visit, she was maintaining NSR with a normal EKG however echocardiogram was recommended to follow-up for structural heart disease. PRN Inderal was discussed however not prescribed.  Echocardiogram was ordered 09/06/2020 however has not yet been performed.   She presents today with her husband and interpreter. She states that since she was last seen, her palpitations have become more frequent and more bothersome. As above, the echocardiogram has not yet ben performed. We discussed that this will need to be obtained. I discussed medications options with our pharmacy team who recommend starting labetalol for tachypalpitations. I have reviewed that she will need to obtain a BP cuff for better monitoring of her BP. I have instructed her to not take the medication if her SBP is <95-100. She will call is BP goes too low or if she has any other side effects.   Past Medical History:  Diagnosis Date  . Arrhythmia    occurs only in pregnancy, had evaluation in [place] with 5th pregnancy, normal evaluation per pt  . Gestational diabetes    preg #5  . Gestational hypertension   . Panic attacks     Past Surgical History:  Procedure Laterality Date  . NO PAST SURGERIES       Current Outpatient Medications  Medication Sig Dispense Refill  . cyclobenzaprine (FLEXERIL) 5 MG tablet Take 1 tablet  (5 mg total) by mouth 3 (three) times daily as needed for muscle spasms. 20 tablet 0  . hydroquinone 4 % cream daily in the afternoon.    . Prenatal Vit-Fe Fumarate-FA (PRENATAL VITAMIN PO) Take by mouth.    Marland Kitchen RETIN-A 0.1 % cream Apply 1 application topically at bedtime.     No current facility-administered medications for this visit.    Allergies:   Patient has no known allergies.    Social History:  The patient  reports that she has never smoked. She has never used smokeless tobacco. She reports that she does not drink alcohol and does not use drugs.   Family History:  The patient's family history includes Healthy in her mother; Heart attack in her father; Heart disease in her father; Hypertension in her paternal grandmother.    ROS:  Please see the history of present illness.Otherwise, review of systems are positive for none.   All other systems are reviewed and negative.    PHYSICAL EXAM: VS:  BP 108/70   Pulse 98   Ht 5\' 1"  (1.549 m)   Wt 191 lb 3.2 oz (86.7 kg)   LMP 05/01/2020   SpO2 99%   BMI 36.13 kg/m  , BMI Body mass index is 36.13 kg/m.   General: Pregnant, well nourished, NAD Lungs:Clear to ausculation bilaterally. Breathing is unlabored. Cardiovascular: RRR with S1 S2. No murmurs Extremities: Mild edema. Radial pulses 2+ bilaterally Neuro: Alert and oriented. No focal deficits. No facial asymmetry.  MAE spontaneously. Psych: Responds to questions appropriately with normal affect.    EKG:  EKG is not ordered today.  Recent Labs: 11/11/2020: Hemoglobin 12.0; Platelets 186    Lipid Panel No results found for: CHOL, TRIG, HDL, CHOLHDL, VLDL, LDLCALC, LDLDIRECT    Wt Readings from Last 3 Encounters:  12/12/20 191 lb 3.2 oz (86.7 kg)  11/25/20 185 lb (83.9 kg)  11/18/20 189 lb 8 oz (86 kg)     ASSESSMENT AND PLAN:  1. Palpitations: -Reports that since she was last seen, her palpitations have become more frequent and more bothersome. As above, the  echocardiogram has not yet been performed. We discussed that this will need to be obtained. I discussed medications options with our pharmacy team who recommend starting labetalol 100mg  BID for tachypalpitations. I have reviewed that she will need to obtain a BP cuff for better monitoring of her BP. I have instructed her to not take the medication if her SBP is <95-100. She will call is BP goes too low or if she has any other side effects.  -She is to follow up after echocardiogram has been performed    Current medicines are reviewed at length with the patient today.  The patient does not have concerns regarding medicines.  The following changes have been made:  Add labetalol 100mg  BID   Labs/ tests ordered today include: Echocardiogram  No orders of the defined types were placed in this encounter.    Disposition:   FU with Dr. or APP in 1 month  Signed, , NP  12/12/2020 4:20 PM    [facility] [address] [place], [place], [place]  [place] Phone: [phone]; Fax: [phone]

## 2020-12-12 ENCOUNTER — Encounter: Payer: Self-pay | Admitting: Cardiology

## 2020-12-12 ENCOUNTER — Other Ambulatory Visit: Payer: Self-pay

## 2020-12-12 ENCOUNTER — Ambulatory Visit (INDEPENDENT_AMBULATORY_CARE_PROVIDER_SITE_OTHER): Payer: Medicaid Other | Admitting: Cardiology

## 2020-12-12 ENCOUNTER — Other Ambulatory Visit: Payer: Self-pay | Admitting: Cardiology

## 2020-12-12 VITALS — BP 108/70 | HR 98 | Ht 61.0 in | Wt 191.2 lb

## 2020-12-12 DIAGNOSIS — R002 Palpitations: Secondary | ICD-10-CM | POA: Diagnosis not present

## 2020-12-12 DIAGNOSIS — R000 Tachycardia, unspecified: Secondary | ICD-10-CM | POA: Diagnosis not present

## 2020-12-12 MED ORDER — LABETALOL HCL 100 MG PO TABS: 100.0000 mg | ORAL_TABLET | Freq: Two times a day (BID) | ORAL | 3 refills | Status: DC

## 2020-12-12 NOTE — Patient Instructions (Signed)
Medication Instructions:  Your physician has recommended you make the following change in your medication:  1. START LABETALOL 100 MG TWICE DAILY.  IF SYSTOLIC (TOP NUMBER) BLOOD PRESSURE FALLS BELOW 90, PLEASE HOLD THIS MEDICATION AND CALL OUR OFFICE.  *If you need a refill on your cardiac medications before your next appointment, please call your pharmacy*   Lab Work: NONE If you have labs (blood work) drawn today and your tests are completely normal, you will receive your results only by: Marland Kitchen MyChart Message (if you have MyChart) OR . A paper copy in the mail If you have any lab test that is abnormal or we need to change your treatment, we will call you to review the results.   Testing/Procedures: YOUR PROVIDER RECOMMENDS THAT YOU RESCHEDULE YOUR ECHOCARDIOGRAM.   Follow-Up: At [facility], you and your health needs are our priority.  As part of our continuing mission to provide you with exceptional heart care, we have created designated Provider Care Teams.  These Care Teams include your primary Cardiologist (physician) and Advanced Practice Providers (APPs -  Physician Assistants and Nurse Practitioners) who all work together to provide you with the care you need, when you need it.  We recommend signing up for the patient portal called "MyChart".  Sign up information is provided on this After Visit Summary.  MyChart is used to connect with patients for Virtual Visits (Telemedicine).  Patients are able to view lab/test results, encounter notes, upcoming appointments, etc.  Non-urgent messages can be sent to your provider as well.   To learn more about what you can do with MyChart, go to [email or web address].    Your next appointment:   FOLLOW UP AFTER ECHO IS RESCHEDULE AND COMPLETED   The format for your next appointment:   In Person  Provider:   You may see NISHAN or one of the following Advanced Practice Providers on your designated Care Team:    Georgie Chard,  NP

## 2020-12-13 ENCOUNTER — Encounter: Payer: Self-pay | Admitting: Obstetrics and Gynecology

## 2020-12-14 ENCOUNTER — Ambulatory Visit: Payer: Self-pay | Admitting: Physical Therapy

## 2020-12-15 ENCOUNTER — Telehealth: Payer: Self-pay | Admitting: Family Medicine

## 2020-12-15 NOTE — Telephone Encounter (Signed)
Called patient with assistance of International Paper, Research officer, trade union. Patient did not answer. Voice mailbox not set up so not able to leave a message.

## 2020-12-15 NOTE — Telephone Encounter (Signed)
Spanish - states that the pharmacy will not  fill RX due to name not correct on order and other issues pt is self pay and need a referral for dental. Pt request a call concerning this info. Thanks

## 2020-12-15 NOTE — Telephone Encounter (Addendum)
Called patient again with assistance of International Paper, Research officer, trade union.   Patient reports she is calling in regards to an antibiotic called in to the Pharmacy 2 months ago, chart review she was sent in an antibiotic 3 months ago. She is having a lot of drainage on her gums and MD prescribed it.   Called patients pharmacy and they report patient has 2 profiles and that there is no prescription on file for the patient in either profile. Per chart review, was sent 2/5.   Reviewed with Dr. Vergie Living and he recommends patient be seen prior to prescribing ATB.   Patient asking for referral to Physical Therapy as she could not afford appointments previously but now has Medicaid. She cancelled her last appt. Due to inability to pay. Gave her the number to PT at [facility] to call and reschedule.   Per front desk, Informed patient that no sooner appt are available before her appt on 5/26 for teeth. Informed her to go to an Urgent care if she continues to have trouble with her teeth or mouth prior to her appointment.   Reviewed with patient that referral was sent to [facility] this morning and she can call them at [phone].   Patient voiced understanding. Patient informed of her upcoming appointment on 5/26.

## 2020-12-16 ENCOUNTER — Other Ambulatory Visit: Payer: Self-pay

## 2020-12-16 MED ORDER — LABETALOL HCL 100 MG PO TABS: 100.0000 mg | ORAL_TABLET | Freq: Two times a day (BID) | ORAL | 3 refills | Status: DC

## 2020-12-19 ENCOUNTER — Ambulatory Visit: Payer: Medicaid Other | Attending: Obstetrics and Gynecology

## 2020-12-19 ENCOUNTER — Encounter: Payer: Self-pay | Admitting: *Deleted

## 2020-12-19 ENCOUNTER — Ambulatory Visit: Payer: Medicaid Other | Admitting: *Deleted

## 2020-12-19 ENCOUNTER — Other Ambulatory Visit: Payer: Self-pay | Admitting: Obstetrics and Gynecology

## 2020-12-19 ENCOUNTER — Other Ambulatory Visit: Payer: Self-pay

## 2020-12-19 DIAGNOSIS — O0990 Supervision of high risk pregnancy, unspecified, unspecified trimester: Secondary | ICD-10-CM

## 2020-12-19 DIAGNOSIS — Z8632 Personal history of gestational diabetes: Secondary | ICD-10-CM | POA: Insufficient documentation

## 2020-12-19 DIAGNOSIS — O09899 Supervision of other high risk pregnancies, unspecified trimester: Secondary | ICD-10-CM | POA: Diagnosis not present

## 2020-12-19 DIAGNOSIS — O09299 Supervision of pregnancy with other poor reproductive or obstetric history, unspecified trimester: Secondary | ICD-10-CM | POA: Insufficient documentation

## 2020-12-20 ENCOUNTER — Other Ambulatory Visit: Payer: Self-pay | Admitting: *Deleted

## 2020-12-20 DIAGNOSIS — IMO0002 Reserved for concepts with insufficient information to code with codable children: Secondary | ICD-10-CM

## 2020-12-20 DIAGNOSIS — O350XX0 Maternal care for (suspected) central nervous system malformation in fetus, not applicable or unspecified: Secondary | ICD-10-CM

## 2020-12-21 ENCOUNTER — Other Ambulatory Visit: Payer: Self-pay | Admitting: *Deleted

## 2020-12-21 DIAGNOSIS — O10919 Unspecified pre-existing hypertension complicating pregnancy, unspecified trimester: Secondary | ICD-10-CM

## 2020-12-26 ENCOUNTER — Other Ambulatory Visit: Payer: Medicaid Other

## 2020-12-29 ENCOUNTER — Other Ambulatory Visit: Payer: Self-pay

## 2020-12-29 ENCOUNTER — Ambulatory Visit (INDEPENDENT_AMBULATORY_CARE_PROVIDER_SITE_OTHER): Payer: Medicaid Other | Admitting: Family Medicine

## 2020-12-29 ENCOUNTER — Encounter: Payer: Self-pay | Admitting: Family Medicine

## 2020-12-29 VITALS — BP 115/86 | HR 94 | Wt 189.0 lb

## 2020-12-29 DIAGNOSIS — R000 Tachycardia, unspecified: Secondary | ICD-10-CM

## 2020-12-29 DIAGNOSIS — O0990 Supervision of high risk pregnancy, unspecified, unspecified trimester: Secondary | ICD-10-CM

## 2020-12-29 MED ORDER — LABETALOL HCL 100 MG PO TABS: ORAL_TABLET | ORAL | 3 refills | Status: DC

## 2020-12-29 NOTE — Progress Notes (Signed)
   PRENATAL VISIT NOTE  Subjective:  Alyssa Blackwell is a 35 y.o. [phone] at [redacted]w[redacted]d being seen today for ongoing prenatal care.  She is currently monitored for the following issues for this high-risk pregnancy and has Supervision of high risk pregnancy, antepartum; History of gestational diabetes in prior pregnancy, currently pregnant; Vulvovaginitis; Language barrier; Dental abscess; and Tachycardia on their problem list.  Patient reports reports palpitations, not greatly improved by beta blocker.  Contractions: Irregular. Vag. Bleeding: None.  Movement: Present. Denies leaking of fluid.   The following portions of the patient's history were reviewed and updated as appropriate: allergies, current medications, past family history, past medical history, past social history, past surgical history and problem list.   Objective:   Vitals:   12/29/20 1103  BP: 115/86  Pulse: 94  Weight: 189 lb (85.7 kg)    Fetal Status: Fetal Heart Rate (bpm): 136   Movement: Present     General:  Alert, oriented and cooperative. Patient is in no acute distress.  Skin: Skin is warm and dry. No rash noted.   Cardiovascular: Normal heart rate noted  Respiratory: Normal respiratory effort, no problems with respiration noted  Abdomen: Soft, gravid, appropriate for gestational age.  Pain/Pressure: Present     Pelvic: Cervical exam deferred        Extremities: Normal range of motion.  Edema: None  Mental Status: Normal mood and affect. Normal behavior. Normal judgment and thought content.   Assessment and Plan:  Pregnancy: [id number] at [redacted]w[redacted]d 1. Supervision of high risk pregnancy, antepartum - UTD with routine care - Needs pap per care gaps but had in 2021 at [facility]-- release has been signed. Routed message to pool to follow up records and consider collection at next visit.  - labetalol (NORMODYNE) 100 MG tablet; Take 2 tablets in the morning and 1 tablet in the eventing IF SYSTOLIC (TOP NUMBER) BLOOD  PRESSURE FALLS BELOW 90, PLEASE HOLD MEDICATION.  Dispense: 180 tablet; Refill: 3  2. Tachycardia Saw cardiology and put on BB. Reports minimal improvement of sx.  Will increase to 200 AM and 100mg  PM Patient knows when to hold med - labetalol (NORMODYNE) 100 MG tablet; Take 2 tablets in the morning and 1 tablet in the eventing IF SYSTOLIC (TOP NUMBER) BLOOD PRESSURE FALLS BELOW 90, PLEASE HOLD MEDICATION.  Dispense: 180 tablet; Refill: 3   Preterm labor symptoms and general obstetric precautions including but not limited to vaginal bleeding, contractions, leaking of fluid and fetal movement were reviewed in detail with the patient. Please refer to After Visit Summary for other counseling recommendations.   Return in about 2 weeks (around 01/12/2021) for Routine prenatal care, in person, MD only.  Future Appointments  Date Time Provider Department Center  01/04/2021 11:15 AM [facility]WOCA NST [facility]  01/09/2021  3:05 PM MC-CV CH ECHO 5 [facility]SITE3ECHO [facility]  01/11/2021 11:15 AM [facility] NST [facility]  01/12/2021  1:15 PM 03/14/2021, MD [facility]  01/16/2021  8:30 AM W[facility]-MFC NURSE [facility]  01/16/2021  8:45 AM [facility] US4 [facility]MFCUS [facility]  01/30/2021  3:15 PM 02/01/2021, NP [facility]    Filbert Schilder, MD

## 2020-12-29 NOTE — Patient Instructions (Signed)
    Dental Resources   [place]   Dr. Carl Little  Exam $85   [address]  Extraction $120 and up   [place], [place]  *full list of prices available*   [phone]      [facility]  Exam $94   [address]  Exam w/ Xrays $380   [place], [place]  Xrays $68 and up   [phone]  Cleaning $101   Extraction $190 and up      [facility]  Cleaning + Xray $344   [address]  Extraction- pt has to be seen first to give price   [place], [place]   [phone]     [place]   Dr. Ben Turner/Dr. Clay Burton  Exam, Cleaning, Xray $262   [address]  Extraction $207-$317   [place]   [phone]      [facility]  Cleaning $5   [address]  Xray $5   [place], [place] [address]  Call to get on waiting list   [phone] ext [phone]     Dr. James McMasters/Dr. Eric Sadler   [address]  Xray $85 Each   [place], [place] [address]  Extraction $200    Dr. Stacey Green  Extraction $300 per tooth   [address]   [place], [place] [address]   [phone]      Dr. Janna Civils  Cleaning $300   [address]  Extraction $273   [place], [place] [address]   [phone]     [place]   [facility]  Emergency Exam $65   [address]  Cleaning & Exam $150   [place], [place] [address]  Extractions: Simple $180 Surgical $250   [phone]  Fillings $150-$225     

## 2021-01-04 ENCOUNTER — Telehealth: Payer: Self-pay | Admitting: *Deleted

## 2021-01-04 ENCOUNTER — Other Ambulatory Visit: Payer: Medicaid Other

## 2021-01-04 DIAGNOSIS — Z419 Encounter for procedure for purposes other than remedying health state, unspecified: Secondary | ICD-10-CM | POA: Diagnosis not present

## 2021-01-04 NOTE — Telephone Encounter (Signed)
Called pt w/interpreter Alyssa Blackwell in regards to missed appointment today for NST/BPP.  She did not answer and outgoing message stated that the person called has a voice mailbox which has not been set up yet. Pt does not have an active Mychart account.

## 2021-01-09 ENCOUNTER — Other Ambulatory Visit: Payer: Self-pay

## 2021-01-09 ENCOUNTER — Ambulatory Visit (HOSPITAL_COMMUNITY): Payer: Medicaid Other | Attending: Internal Medicine

## 2021-01-09 DIAGNOSIS — R002 Palpitations: Secondary | ICD-10-CM | POA: Diagnosis not present

## 2021-01-09 DIAGNOSIS — R000 Tachycardia, unspecified: Secondary | ICD-10-CM | POA: Diagnosis not present

## 2021-01-09 LAB — ECHOCARDIOGRAM COMPLETE
Area-P 1/2: 3.46 cm2
S' Lateral: 2.3 cm

## 2021-01-09 NOTE — Progress Notes (Signed)
Patient ID: Alyssa Blackwell, female   DOB: [birth year], 35 y.o.   MRN: [id number]  Interpeter, Alyssa Blackwell is present.

## 2021-01-11 ENCOUNTER — Ambulatory Visit (INDEPENDENT_AMBULATORY_CARE_PROVIDER_SITE_OTHER): Payer: Medicaid Other

## 2021-01-11 ENCOUNTER — Ambulatory Visit: Payer: Medicaid Other | Admitting: *Deleted

## 2021-01-11 ENCOUNTER — Other Ambulatory Visit: Payer: Self-pay

## 2021-01-11 VITALS — BP 127/90 | HR 91 | Wt 188.1 lb

## 2021-01-11 DIAGNOSIS — Z5941 Food insecurity: Secondary | ICD-10-CM

## 2021-01-11 DIAGNOSIS — O10919 Unspecified pre-existing hypertension complicating pregnancy, unspecified trimester: Secondary | ICD-10-CM

## 2021-01-11 NOTE — Progress Notes (Signed)
Pt informed that the ultrasound is considered a limited OB ultrasound and is not intended to be a complete ultrasound exam.  Patient also informed that the ultrasound is not being completed with the intent of assessing for fetal or placental anomalies or any pelvic abnormalities.  Explained that the purpose of today's ultrasound is to assess for presentation, BPP and amniotic fluic volume.  Patient acknowledges the purpose of the exam and the limitations of the study.    Pt reports decreased FM x4 days.  BPP is 10/10 today and pt felt good FM during BPP and NST.

## 2021-01-12 ENCOUNTER — Ambulatory Visit (INDEPENDENT_AMBULATORY_CARE_PROVIDER_SITE_OTHER): Payer: Medicaid Other | Admitting: Obstetrics and Gynecology

## 2021-01-12 ENCOUNTER — Other Ambulatory Visit (HOSPITAL_COMMUNITY)
Admission: RE | Admit: 2021-01-12 | Discharge: 2021-01-12 | Disposition: A | Discharge status: Home / Self Care | Payer: Medicaid Other | Source: Ambulatory Visit | Attending: Obstetrics and Gynecology | Admitting: Obstetrics and Gynecology

## 2021-01-12 VITALS — BP 118/88 | HR 101 | Wt 188.2 lb

## 2021-01-12 DIAGNOSIS — Z8632 Personal history of gestational diabetes: Secondary | ICD-10-CM

## 2021-01-12 DIAGNOSIS — O368131 Decreased fetal movements, third trimester, fetus 1: Secondary | ICD-10-CM

## 2021-01-12 DIAGNOSIS — O0990 Supervision of high risk pregnancy, unspecified, unspecified trimester: Secondary | ICD-10-CM

## 2021-01-12 DIAGNOSIS — Z113 Encounter for screening for infections with a predominantly sexual mode of transmission: Secondary | ICD-10-CM | POA: Insufficient documentation

## 2021-01-12 DIAGNOSIS — Z789 Other specified health status: Secondary | ICD-10-CM

## 2021-01-12 DIAGNOSIS — R000 Tachycardia, unspecified: Secondary | ICD-10-CM

## 2021-01-12 DIAGNOSIS — Z3403 Encounter for supervision of normal first pregnancy, third trimester: Secondary | ICD-10-CM | POA: Diagnosis not present

## 2021-01-12 DIAGNOSIS — O09299 Supervision of pregnancy with other poor reproductive or obstetric history, unspecified trimester: Secondary | ICD-10-CM

## 2021-01-12 DIAGNOSIS — Z3A36 36 weeks gestation of pregnancy: Secondary | ICD-10-CM | POA: Insufficient documentation

## 2021-01-12 LAB — POCT URINALYSIS DIP (DEVICE)
Bilirubin Urine: NEGATIVE
Glucose, UA: NEGATIVE mg/dL
Hgb urine dipstick: NEGATIVE
Ketones, ur: NEGATIVE mg/dL
Leukocytes,Ua: NEGATIVE
Nitrite: NEGATIVE
Protein, ur: NEGATIVE mg/dL
Specific Gravity, Urine: 1.025 (ref 1.005–1.030)
Urobilinogen, UA: 1 mg/dL (ref 0.0–1.0)
pH: 6.5 (ref 5.0–8.0)

## 2021-01-12 NOTE — Progress Notes (Signed)
   PRENATAL VISIT NOTE  Subjective:  Alyssa Blackwell is a 35 y.o. [phone] at [redacted]w[redacted]d being seen today for ongoing prenatal care.  She is currently monitored for the following issues for this low-risk pregnancy and has Supervision of high risk pregnancy, antepartum; History of gestational diabetes in prior pregnancy, currently pregnant; Vulvovaginitis; Language barrier; Dental abscess; Tachycardia; [redacted] weeks gestation of pregnancy; and Decreased fetal movement affecting management of pregnancy in third trimester, fetus 1 on their problem list.  Patient doing well with no acute concerns today. She reports no complaints.  Contractions: Irritability. Vag. Bleeding: None.  Movement: (!) Decreased. Denies leaking of fluid. Pt poor historian regarding movement.  NST ordered.  The following portions of the patient's history were reviewed and updated as appropriate: allergies, current medications, past family history, past medical history, past social history, past surgical history and problem list. Problem list updated.  Objective:   Vitals:   01/12/21 1323  BP: 118/88  Pulse: (!) 101  Weight: 188 lb 3.2 oz (85.4 kg)    Fetal Status: Fetal Heart Rate (bpm): 131 Fundal Height: 36 cm Movement: (!) Decreased     General:  Alert, oriented and cooperative. Patient is in no acute distress.  Skin: Skin is warm and dry. No rash noted.   Cardiovascular: Normal heart rate noted  Respiratory: Normal respiratory effort, no problems with respiration noted  Abdomen: Soft, gravid, appropriate for gestational age.  Pain/Pressure: Present     Pelvic: Cervical exam performed Dilation: 1.5 Effacement (%): 70 Station: -3  Extremities: Normal range of motion.  Edema: Trace  Mental Status:  Normal mood and affect. Normal behavior. Normal judgment and thought content.   Assessment and Plan:  Pregnancy: [id number] at [redacted]w[redacted]d  1. History of gestational diabetes in prior pregnancy, currently pregnant   2.  Language barrier Interpreter present  3. Supervision of high risk pregnancy, antepartum  - AMBULATORY REFERRAL TO [facility]  4. Tachycardia Mild, pt asymptomatic  5. [redacted] weeks gestation of pregnancy  - Culture, beta strep (group b only) - Cervicovaginal ancillary only( [place])  6. Decreased fetal movement affecting management of pregnancy in third trimester, fetus 1 NST ordered, NST reactive  FHT 135, accels noted, no decels, occasional ctx.  Preterm labor symptoms and general obstetric precautions including but not limited to vaginal bleeding, contractions, leaking of fluid and fetal movement were reviewed in detail with the patient.  Please refer to After Visit Summary for other counseling recommendations.   Return in about 1 week (around 01/19/2021) for LOB, in person.   Mariel Aloe, MD Faculty Attending Center for [facility]

## 2021-01-12 NOTE — Addendum Note (Signed)
Addended by: Guy Begin on: 01/12/2021 04:37 PM   Modules accepted: Orders

## 2021-01-13 LAB — CERVICOVAGINAL ANCILLARY ONLY
Chlamydia: NEGATIVE
Comment: NEGATIVE
Comment: NORMAL
Neisseria Gonorrhea: NEGATIVE

## 2021-01-13 NOTE — Progress Notes (Signed)
Patient seen and assessed by nursing staff.  Agree with documentation and plan.  

## 2021-01-13 NOTE — Progress Notes (Signed)
NST:  Baseline: 135 bpm, Variability: Good {> 6 bpm), Accelerations: Reactive, and Decelerations: Absent   

## 2021-01-15 LAB — CULTURE, BETA STREP (GROUP B ONLY): Strep Gp B Culture: POSITIVE — AB

## 2021-01-16 ENCOUNTER — Encounter: Payer: Self-pay | Admitting: *Deleted

## 2021-01-16 ENCOUNTER — Ambulatory Visit: Payer: Medicaid Other | Attending: Obstetrics and Gynecology

## 2021-01-16 ENCOUNTER — Other Ambulatory Visit: Payer: Self-pay

## 2021-01-16 ENCOUNTER — Ambulatory Visit: Payer: Medicaid Other | Admitting: *Deleted

## 2021-01-16 VITALS — BP 134/82 | HR 97

## 2021-01-16 DIAGNOSIS — O09299 Supervision of pregnancy with other poor reproductive or obstetric history, unspecified trimester: Secondary | ICD-10-CM | POA: Insufficient documentation

## 2021-01-16 DIAGNOSIS — Z3A37 37 weeks gestation of pregnancy: Secondary | ICD-10-CM

## 2021-01-16 DIAGNOSIS — O10013 Pre-existing essential hypertension complicating pregnancy, third trimester: Secondary | ICD-10-CM

## 2021-01-16 DIAGNOSIS — I499 Cardiac arrhythmia, unspecified: Secondary | ICD-10-CM

## 2021-01-16 DIAGNOSIS — Z8632 Personal history of gestational diabetes: Secondary | ICD-10-CM

## 2021-01-16 DIAGNOSIS — O09213 Supervision of pregnancy with history of pre-term labor, third trimester: Secondary | ICD-10-CM | POA: Diagnosis not present

## 2021-01-16 DIAGNOSIS — O0990 Supervision of high risk pregnancy, unspecified, unspecified trimester: Secondary | ICD-10-CM | POA: Diagnosis not present

## 2021-01-16 DIAGNOSIS — R000 Tachycardia, unspecified: Secondary | ICD-10-CM

## 2021-01-16 DIAGNOSIS — O99413 Diseases of the circulatory system complicating pregnancy, third trimester: Secondary | ICD-10-CM

## 2021-01-16 DIAGNOSIS — O10919 Unspecified pre-existing hypertension complicating pregnancy, unspecified trimester: Secondary | ICD-10-CM | POA: Diagnosis not present

## 2021-01-16 DIAGNOSIS — Z363 Encounter for antenatal screening for malformations: Secondary | ICD-10-CM

## 2021-01-16 DIAGNOSIS — E669 Obesity, unspecified: Secondary | ICD-10-CM | POA: Diagnosis not present

## 2021-01-16 DIAGNOSIS — O350XX0 Maternal care for (suspected) central nervous system malformation in fetus, not applicable or unspecified: Secondary | ICD-10-CM | POA: Insufficient documentation

## 2021-01-16 DIAGNOSIS — O99213 Obesity complicating pregnancy, third trimester: Secondary | ICD-10-CM | POA: Diagnosis not present

## 2021-01-16 DIAGNOSIS — IMO0002 Reserved for concepts with insufficient information to code with codable children: Secondary | ICD-10-CM

## 2021-01-18 ENCOUNTER — Ambulatory Visit (INDEPENDENT_AMBULATORY_CARE_PROVIDER_SITE_OTHER): Payer: Medicaid Other | Admitting: Family Medicine

## 2021-01-18 ENCOUNTER — Other Ambulatory Visit: Payer: Self-pay

## 2021-01-18 ENCOUNTER — Other Ambulatory Visit: Payer: Self-pay | Admitting: Advanced Practice Midwife

## 2021-01-18 VITALS — BP 123/86 | HR 90 | Wt 190.2 lb

## 2021-01-18 DIAGNOSIS — R000 Tachycardia, unspecified: Secondary | ICD-10-CM

## 2021-01-18 DIAGNOSIS — Z5941 Food insecurity: Secondary | ICD-10-CM

## 2021-01-18 DIAGNOSIS — O0990 Supervision of high risk pregnancy, unspecified, unspecified trimester: Secondary | ICD-10-CM

## 2021-01-18 DIAGNOSIS — O99820 Streptococcus B carrier state complicating pregnancy: Secondary | ICD-10-CM | POA: Insufficient documentation

## 2021-01-18 DIAGNOSIS — Z789 Other specified health status: Secondary | ICD-10-CM

## 2021-01-18 MED ORDER — LABETALOL HCL 100 MG PO TABS: 200.0000 mg | ORAL_TABLET | Freq: Two times a day (BID) | ORAL | 3 refills | Status: DC

## 2021-01-18 NOTE — Progress Notes (Signed)
Pt states heart races during the night, hard to sleep. Pt also having a lot of pressure.

## 2021-01-18 NOTE — Patient Instructions (Signed)

## 2021-01-18 NOTE — Progress Notes (Signed)
   Subjective:  Alyssa Blackwell is a 35 y.o. [phone] at [redacted]w[redacted]d being seen today for ongoing prenatal care.  She is currently monitored for the following issues for this low-risk pregnancy and has Supervision of high risk pregnancy, antepartum; History of gestational diabetes in prior pregnancy, currently pregnant; Vulvovaginitis; Language barrier; Dental abscess; Tachycardia; Decreased fetal movement affecting management of pregnancy in third trimester, fetus 1; and GBS (group B Streptococcus carrier), +RV culture, currently pregnant on their problem list.  Patient reports  pelvic pressure .  Contractions: Irritability. Vag. Bleeding: None.  Movement: Present. Denies leaking of fluid.   The following portions of the patient's history were reviewed and updated as appropriate: allergies, current medications, past family history, past medical history, past social history, past surgical history and problem list. Problem list updated.  Objective:   Vitals:   01/18/21 1129  BP: 123/86  Pulse: 90  Weight: 190 lb 3.2 oz (86.3 kg)    Fetal Status: Fetal Heart Rate (bpm): 139   Movement: Present     General:  Alert, oriented and cooperative. Patient is in no acute distress.  Skin: Skin is warm and dry. No rash noted.   Cardiovascular: Normal heart rate noted  Respiratory: Normal respiratory effort, no problems with respiration noted  Abdomen: Soft, gravid, appropriate for gestational age. Pain/Pressure: Present     Pelvic: Vag. Bleeding: None     Cervical exam performed        Extremities: Normal range of motion.  Edema: Trace  Mental Status: Normal mood and affect. Normal behavior. Normal judgment and thought content.   Urinalysis:      Assessment and Plan:  Pregnancy: [id number] at [redacted]w[redacted]d  1. Food insecurity  - AMBULATORY REFERRAL TO [facility]  2. Supervision of high risk pregnancy, antepartum BP and FHR normal Last growth [place] measuring 92%, 3613g Would like 39wk  IOL, schedule appears full at 0d, scheduled for [redacted]w[redacted]d (01/30/21), IOL orders placed for FB/pit induction  3. Language barrier Spanish  4. GBS (group B Streptococcus carrier), +RV culture, currently pregnant Tx in labor  5. Tachycardia Takes labetalol 200mg  qAM and 100mg  qPM for tachycardia BP normal Pulse 90, can tolerate slight increase, will go to 200mg  BID  Term labor symptoms and general obstetric precautions including but not limited to vaginal bleeding, contractions, leaking of fluid and fetal movement were reviewed in detail with the patient. Please refer to After Visit Summary for other counseling recommendations.  Return in 1 week (on 01/25/2021).   , MD

## 2021-01-19 ENCOUNTER — Inpatient Hospital Stay (HOSPITAL_COMMUNITY): Payer: Medicaid Other | Admitting: Anesthesiology

## 2021-01-19 ENCOUNTER — Encounter (HOSPITAL_COMMUNITY): Payer: Self-pay | Admitting: Family Medicine

## 2021-01-19 ENCOUNTER — Inpatient Hospital Stay (HOSPITAL_COMMUNITY)
Admission: AD | Admit: 2021-01-19 | Discharge: 2021-01-20 | DRG: 807 | Disposition: A | Discharge status: Home / Self Care | Payer: Medicaid Other | Attending: Obstetrics & Gynecology | Admitting: Obstetrics & Gynecology

## 2021-01-19 DIAGNOSIS — O09299 Supervision of pregnancy with other poor reproductive or obstetric history, unspecified trimester: Secondary | ICD-10-CM

## 2021-01-19 DIAGNOSIS — R000 Tachycardia, unspecified: Secondary | ICD-10-CM | POA: Diagnosis present

## 2021-01-19 DIAGNOSIS — O9942 Diseases of the circulatory system complicating childbirth: Secondary | ICD-10-CM | POA: Diagnosis not present

## 2021-01-19 DIAGNOSIS — Z789 Other specified health status: Secondary | ICD-10-CM | POA: Diagnosis present

## 2021-01-19 DIAGNOSIS — O24429 Gestational diabetes mellitus in childbirth, unspecified control: Secondary | ICD-10-CM | POA: Diagnosis not present

## 2021-01-19 DIAGNOSIS — O26893 Other specified pregnancy related conditions, third trimester: Secondary | ICD-10-CM | POA: Diagnosis not present

## 2021-01-19 DIAGNOSIS — O99820 Streptococcus B carrier state complicating pregnancy: Secondary | ICD-10-CM

## 2021-01-19 DIAGNOSIS — Z8632 Personal history of gestational diabetes: Secondary | ICD-10-CM

## 2021-01-19 DIAGNOSIS — O99824 Streptococcus B carrier state complicating childbirth: Secondary | ICD-10-CM | POA: Diagnosis not present

## 2021-01-19 DIAGNOSIS — Z20822 Contact with and (suspected) exposure to covid-19: Secondary | ICD-10-CM | POA: Diagnosis present

## 2021-01-19 DIAGNOSIS — Z758 Other problems related to medical facilities and other health care: Secondary | ICD-10-CM | POA: Diagnosis present

## 2021-01-19 DIAGNOSIS — O164 Unspecified maternal hypertension, complicating childbirth: Secondary | ICD-10-CM | POA: Diagnosis not present

## 2021-01-19 DIAGNOSIS — O0990 Supervision of high risk pregnancy, unspecified, unspecified trimester: Secondary | ICD-10-CM

## 2021-01-19 DIAGNOSIS — Z3A37 37 weeks gestation of pregnancy: Secondary | ICD-10-CM | POA: Diagnosis not present

## 2021-01-19 LAB — RESP PANEL BY RT-PCR (FLU A&B, COVID) ARPGX2
Influenza A by PCR: NEGATIVE
Influenza B by PCR: NEGATIVE
SARS Coronavirus 2 by RT PCR: NEGATIVE

## 2021-01-19 LAB — CBC
HCT: 38.9 % (ref 36.0–46.0)
Hemoglobin: 13 g/dL (ref 12.0–15.0)
MCH: 31.8 pg (ref 26.0–34.0)
MCHC: 33.4 g/dL (ref 30.0–36.0)
MCV: 95.1 fL (ref 80.0–100.0)
Platelets: 192 10*3/uL (ref 150–400)
RBC: 4.09 MIL/uL (ref 3.87–5.11)
RDW: 13.2 % (ref 11.5–15.5)
WBC: 12.4 10*3/uL — ABNORMAL HIGH (ref 4.0–10.5)
nRBC: 0 % (ref 0.0–0.2)

## 2021-01-19 LAB — RPR: RPR Ser Ql: NONREACTIVE

## 2021-01-19 LAB — TYPE AND SCREEN
ABO/RH(D): O POS
Antibody Screen: NEGATIVE

## 2021-01-19 MED ORDER — OXYTOCIN 10 UNIT/ML IJ SOLN
INTRAMUSCULAR | Status: AC
  Filled 2021-01-19: qty 1

## 2021-01-19 MED ORDER — LACTATED RINGERS IV SOLN: 500.0000 mL | Freq: Once | INTRAVENOUS | Status: DC

## 2021-01-19 MED ORDER — EPHEDRINE 5 MG/ML INJ: 10.0000 mg | INTRAVENOUS | Status: DC | PRN

## 2021-01-19 MED ORDER — FENTANYL CITRATE (PF) 100 MCG/2ML IJ SOLN
50.0000 ug | INTRAMUSCULAR | Status: DC | PRN
  Administered 2021-01-19: 50 ug via INTRAVENOUS
  Administered 2021-01-19: 100 ug via INTRAVENOUS
  Filled 2021-01-19 (×2): qty 2

## 2021-01-19 MED ORDER — DIPHENHYDRAMINE HCL 25 MG PO CAPS: 25.0000 mg | ORAL_CAPSULE | Freq: Four times a day (QID) | ORAL | Status: DC | PRN

## 2021-01-19 MED ORDER — ONDANSETRON HCL 4 MG/2ML IJ SOLN: 4.0000 mg | INTRAMUSCULAR | Status: DC | PRN

## 2021-01-19 MED ORDER — SOD CITRATE-CITRIC ACID 500-334 MG/5ML PO SOLN: 30.0000 mL | ORAL | Status: DC | PRN

## 2021-01-19 MED ORDER — BENZOCAINE-MENTHOL 20-0.5 % EX AERO: 1.0000 "application " | INHALATION_SPRAY | CUTANEOUS | Status: DC | PRN

## 2021-01-19 MED ORDER — ONDANSETRON HCL 4 MG/2ML IJ SOLN
4.0000 mg | Freq: Four times a day (QID) | INTRAMUSCULAR | Status: DC | PRN
  Administered 2021-01-19: 4 mg via INTRAVENOUS
  Filled 2021-01-19: qty 2

## 2021-01-19 MED ORDER — MEDROXYPROGESTERONE ACETATE 150 MG/ML IM SUSP
150.0000 mg | INTRAMUSCULAR | Status: AC | PRN
  Administered 2021-01-20: 150 mg via INTRAMUSCULAR
  Filled 2021-01-19: qty 1

## 2021-01-19 MED ORDER — OXYTOCIN-SODIUM CHLORIDE 30-0.9 UT/500ML-% IV SOLN
2.5000 [IU]/h | INTRAVENOUS | Status: DC
  Administered 2021-01-19: 2.5 [IU]/h via INTRAVENOUS
  Filled 2021-01-19: qty 500

## 2021-01-19 MED ORDER — LIDOCAINE HCL (PF) 1 % IJ SOLN
INTRAMUSCULAR | Status: DC | PRN
  Administered 2021-01-19 (×2): 5 mL via EPIDURAL

## 2021-01-19 MED ORDER — LACTATED RINGERS IV SOLN: 500.0000 mL | INTRAVENOUS | Status: DC | PRN

## 2021-01-19 MED ORDER — DIPHENHYDRAMINE HCL 50 MG/ML IJ SOLN: 12.5000 mg | INTRAMUSCULAR | Status: DC | PRN

## 2021-01-19 MED ORDER — LACTATED RINGERS IV SOLN: INTRAVENOUS | Status: DC

## 2021-01-19 MED ORDER — SIMETHICONE 80 MG PO CHEW: 80.0000 mg | CHEWABLE_TABLET | ORAL | Status: DC | PRN

## 2021-01-19 MED ORDER — SENNOSIDES-DOCUSATE SODIUM 8.6-50 MG PO TABS
2.0000 | ORAL_TABLET | Freq: Every day | ORAL | Status: DC
  Administered 2021-01-20: 2 via ORAL
  Filled 2021-01-19: qty 2

## 2021-01-19 MED ORDER — PHENYLEPHRINE 40 MCG/ML (10ML) SYRINGE FOR IV PUSH (FOR BLOOD PRESSURE SUPPORT): 80.0000 ug | PREFILLED_SYRINGE | INTRAVENOUS | Status: DC | PRN

## 2021-01-19 MED ORDER — COCONUT OIL OIL: 1.0000 "application " | TOPICAL_OIL | Status: DC | PRN

## 2021-01-19 MED ORDER — PENICILLIN G POT IN DEXTROSE 60000 UNIT/ML IV SOLN: 3.0000 10*6.[IU] | INTRAVENOUS | Status: DC

## 2021-01-19 MED ORDER — TERBUTALINE SULFATE 1 MG/ML IJ SOLN: 0.2500 mg | Freq: Once | INTRAMUSCULAR | Status: DC | PRN

## 2021-01-19 MED ORDER — LIDOCAINE HCL (PF) 1 % IJ SOLN: 30.0000 mL | INTRAMUSCULAR | Status: DC | PRN

## 2021-01-19 MED ORDER — ACETAMINOPHEN 325 MG PO TABS
650.0000 mg | ORAL_TABLET | ORAL | Status: DC | PRN
  Administered 2021-01-19 (×2): 650 mg via ORAL
  Filled 2021-01-19 (×2): qty 2

## 2021-01-19 MED ORDER — FENTANYL-BUPIVACAINE-NACL 0.5-0.125-0.9 MG/250ML-% EP SOLN
12.0000 mL/h | EPIDURAL | Status: DC | PRN
Start: 2021-01-19 — End: 2021-01-19
  Administered 2021-01-19: 11 mL/h via EPIDURAL

## 2021-01-19 MED ORDER — IBUPROFEN 600 MG PO TABS
600.0000 mg | ORAL_TABLET | Freq: Four times a day (QID) | ORAL | Status: DC
  Administered 2021-01-19 – 2021-01-20 (×5): 600 mg via ORAL
  Filled 2021-01-19 (×5): qty 1

## 2021-01-19 MED ORDER — DIBUCAINE (PERIANAL) 1 % EX OINT: 1.0000 "application " | TOPICAL_OINTMENT | CUTANEOUS | Status: DC | PRN

## 2021-01-19 MED ORDER — OXYTOCIN BOLUS FROM INFUSION
333.0000 mL | Freq: Once | INTRAVENOUS | Status: AC
  Administered 2021-01-19: 333 mL via INTRAVENOUS

## 2021-01-19 MED ORDER — ACETAMINOPHEN 325 MG PO TABS: 650.0000 mg | ORAL_TABLET | ORAL | Status: DC | PRN

## 2021-01-19 MED ORDER — ONDANSETRON HCL 4 MG PO TABS: 4.0000 mg | ORAL_TABLET | ORAL | Status: DC | PRN

## 2021-01-19 MED ORDER — OXYTOCIN-SODIUM CHLORIDE 30-0.9 UT/500ML-% IV SOLN: 1.0000 m[IU]/min | INTRAVENOUS | Status: DC

## 2021-01-19 MED ORDER — SODIUM CHLORIDE 0.9 % IV SOLN
5.0000 10*6.[IU] | Freq: Once | INTRAVENOUS | Status: AC
  Administered 2021-01-19: 5 10*6.[IU] via INTRAVENOUS
  Filled 2021-01-19: qty 5

## 2021-01-19 MED ORDER — PRENATAL MULTIVITAMIN CH
1.0000 | ORAL_TABLET | Freq: Every day | ORAL | Status: DC
  Administered 2021-01-19 – 2021-01-20 (×2): 1 via ORAL
  Filled 2021-01-19 (×2): qty 1

## 2021-01-19 MED ORDER — WITCH HAZEL-GLYCERIN EX PADS: 1.0000 "application " | MEDICATED_PAD | CUTANEOUS | Status: DC | PRN

## 2021-01-19 MED ORDER — FENTANYL-BUPIVACAINE-NACL 0.5-0.125-0.9 MG/250ML-% EP SOLN
EPIDURAL | Status: AC
  Filled 2021-01-19: qty 250

## 2021-01-19 NOTE — Anesthesia Procedure Notes (Signed)
Epidural Patient location during procedure: OB Start time: 01/19/2021 2:47 AM End time: 01/19/2021 2:55 AM  Preanesthetic Checklist Completed: patient identified, IV checked, site marked, risks and benefits discussed, surgical consent, monitors and equipment checked, pre-op evaluation and timeout performed  Epidural Patient position: sitting Prep: DuraPrep and site prepped and draped Patient monitoring: continuous pulse ox and blood pressure Approach: midline Location: L3-L4 Injection technique: LOR air  Needle:  Needle type: Tuohy  Needle gauge: 17 G Needle length: 9 cm and 9 Needle insertion depth: 4 cm Catheter type: closed end flexible Catheter size: 19 Gauge Catheter at skin depth: 9 cm Test dose: negative and Other  Assessment Events: blood not aspirated, injection not painful, no injection resistance, no paresthesia and negative IV test  Additional Notes Patient identified. Risks and benefits discussed including failed block, incomplete  Pain control, post dural puncture headache, nerve damage, paralysis, blood pressure Changes, nausea, vomiting, reactions to medications-both toxic and allergic and post Partum back pain. All questions were answered. Patient expressed understanding and wished to proceed. Sterile technique was used throughout procedure. Epidural site was Dressed with sterile barrier dressing. No paresthesias, signs of intravascular injection Or signs of intrathecal spread were encountered. Spanish speaking RN on labor used for procedure. Patient was more comfortable after the epidural was dosed. Please see RN's note for documentation of vital signs and FHR which are stable. Reason for block:procedure for pain

## 2021-01-19 NOTE — MAU Note (Signed)
Pt presents with contractions, denies ROM. Pt very uncomfortable, SVE 9 w/ bulging bag. Call to 1st call dr [email or web address],md.

## 2021-01-19 NOTE — H&P (Addendum)
Alyssa Blackwell is a 35 y.o. [id number] female at [redacted]w[redacted]d by LMP c/w 19wk scan presenting with spont onset of labor.   Reports active fetal movement, contractions: regular, every 2-3 minutes, vaginal bleeding: scant staining, membranes: intact.  Initiated prenatal care at [facility] at 13 wks.    Most recent u/s: 37.1wk, EFW 92%, AFI 12.1cm, cephalic, ant placenta.   This pregnancy complicated by: # tachycardia (taking Labetalol; cards following) # language barrier # GBS pos  Prenatal History/Complications:  # term SVB x 3; 24wk vag del with G2 # tachycardia with all pregnancies; normal eval # hx GDM with G5  Past Medical History: Past Medical History:  Diagnosis Date   Arrhythmia    occurs only in pregnancy, had evaluation in [place] with 33th pregnancy, normal evaluation per pt   Gestational diabetes    preg #5   Gestational hypertension    Panic attacks     Past Surgical History: Past Surgical History:  Procedure Laterality Date   NO PAST SURGERIES      Obstetrical History: OB History     Gravida  6   Para  4   Term  3   Preterm  1   AB  1   Living  3      SAB  1   IAB  0   Ectopic  0   Multiple  0   Live Births  4           Social History: Social History   Socioeconomic History   Marital status: Married    Spouse name: Not on file   Number of children: Not on file   Years of education: Not on file   Highest education level: Not on file  Occupational History   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  Tobacco Use   Smoking status: Never   Smokeless tobacco: Never  Vaping Use   Vaping Use: Never used  Substance and Sexual Activity   Alcohol use: Never   Drug use: Never   Sexual activity: Yes    Birth control/protection: None  Other Topics Concern   Not on file  Social History Narrative   Not on file   Social Determinants of Health   Financial Resource Strain: Not on file  Food Insecurity: Food Insecurity Present   Worried About  Running Out of Food in the Last Year: Sometimes true   Ran Out of Food in the Last Year: Sometimes true  Transportation Needs: No Transportation Needs   Lack of Transportation (Medical): No   Lack of Transportation (Non-Medical): No  Physical Activity: Not on file  Stress: Not on file  Social Connections: Not on file    Family History: Family History  Problem Relation Age of Onset   Heart attack Father    Heart disease Father    Hypertension Paternal Grandmother    Healthy Mother     Allergies: No Known Allergies  Medications Prior to Admission  Medication Sig Dispense Refill Last Dose   cyclobenzaprine (FLEXERIL) 5 MG tablet Take 1 tablet (5 mg total) by mouth 3 (three) times daily as needed for muscle spasms. 20 tablet 0    hydroquinone 4 % cream daily in the afternoon.      labetalol (NORMODYNE) 100 MG tablet Take 2 tablets (200 mg total) by mouth 2 (two) times daily. Take 2 tablets in the morning and 1 tablet in the eventing IF SYSTOLIC (TOP NUMBER) BLOOD PRESSURE FALLS BELOW 90, PLEASE HOLD MEDICATION. 180 tablet 3  Prenatal Vit-Fe Fumarate-FA (PRENATAL VITAMIN PO) Take by mouth.      RETIN-A 0.1 % cream Apply 1 application topically at bedtime.       Review of Systems  Pertinent pos/neg as indicated in HPI  Last menstrual period 05/01/2020. General appearance: alert, cooperative, and moderate distress Lungs: clear to auscultation bilaterally Heart: regular rate and rhythm Abdomen: gravid, soft, non-tender, EFW by Leopold's approximately 7lbs Extremities: 1+ edema DTR's nl  Fetal monitoring: FHR: 140-150 bpm, variability: moderate,  Accelerations: Present,  decelerations:  Absent Uterine activity: Frequency: Every 3 minutes Dilation: Lip/rim Station: 0 Presentation: cephalic   Prenatal labs: ABO, Rh: O/Positive/-- (01/07 1127) Antibody: Negative (01/07 1127) Rubella: 10.80 (01/07 1127) RPR: Non Reactive (04/08 0831)  HBsAg: Negative (01/07 1127)  HIV:  Non Reactive (04/08 0831)  GBS: Positive/-- (06/09 1547)  2hr GTT: 78/134/100  Prenatal Transfer Tool  Maternal Diabetes: No Genetic Screening: Normal Maternal Ultrasounds/Referrals: Normal Fetal Ultrasounds or other Referrals:  None Maternal Substance Abuse:  No Significant Maternal Medications:  Meds include: Other: Labetalol for tachycardia Significant Maternal Lab Results: Group B Strep positive  No results found for this or any previous visit (from the past 24 hour(s)).   Assessment:  [redacted]w[redacted]d SIUP  [id number]  Active labor  Cat 1 FHR  GBS Positive/-- (06/09 1547)  Plan:  Admit to L&D  IV pain meds/epidural prn active labor  Expectant management  PCN for GBS ppx  Anticipate vag del   Plans to breast and bottlefeed  Contraception: Depo    Arabella Merles CNM 01/19/2021, 1:18 AM

## 2021-01-19 NOTE — Anesthesia Postprocedure Evaluation (Signed)
Anesthesia Post Note  Patient: Avrianna Smart  Procedure(s) Performed: AN AD HOC LABOR EPIDURAL     Patient location during evaluation: Mother Baby Anesthesia Type: Epidural Level of consciousness: awake and alert Pain management: pain level controlled Vital Signs Assessment: post-procedure vital signs reviewed and stable Respiratory status: spontaneous breathing, nonlabored ventilation and respiratory function stable Cardiovascular status: stable Postop Assessment: no headache, no backache and epidural receding Anesthetic complications: no   No notable events documented.  Last Vitals:  Vitals:   01/19/21 0750 01/19/21 0950  BP: 118/75 122/77  Pulse: 68 73  Resp: 20 18  Temp: 36.7 C 36.6 C  SpO2:  100%    Last Pain:  Vitals:   01/19/21 0950  TempSrc: Oral  PainSc: 0-No pain   Pain Goal:                   Jamaar Howes

## 2021-01-19 NOTE — Anesthesia Preprocedure Evaluation (Signed)
Anesthesia Evaluation  Patient identified by MRN, date of birth, ID band Patient awake    Reviewed: Allergy & Precautions, Patient's Chart, lab work & pertinent test results  Airway Mallampati: III  TM Distance: >3 FB Neck ROM: Full    Dental  (+) Poor Dentition   Pulmonary neg pulmonary ROS,    Pulmonary exam normal breath sounds clear to auscultation       Cardiovascular hypertension,  Rhythm:Regular Rate:Tachycardia     Neuro/Psych Anxiety negative neurological ROS     GI/Hepatic Neg liver ROS, GERD  ,  Endo/Other  diabetes, Well Controlled, GestationalObesity  Renal/GU negative Renal ROS  negative genitourinary   Musculoskeletal negative musculoskeletal ROS (+)   Abdominal (+) + obese,   Peds  Hematology negative hematology ROS (+)   Anesthesia Other Findings   Reproductive/Obstetrics (+) Pregnancy                             Anesthesia Physical Anesthesia Plan  ASA: 2  Anesthesia Plan: Epidural   Post-op Pain Management:    Induction:   PONV Risk Score and Plan:   Airway Management Planned: Natural Airway  Additional Equipment:   Intra-op Plan:   Post-operative Plan:   Informed Consent: I have reviewed the patients History and Physical, chart, labs and discussed the procedure including the risks, benefits and alternatives for the proposed anesthesia with the patient or authorized representative who has indicated his/her understanding and acceptance.       Plan Discussed with: Anesthesiologist  Anesthesia Plan Comments:         Anesthesia Quick Evaluation

## 2021-01-19 NOTE — Discharge Summary (Addendum)
Postpartum Discharge Summary      Patient Name: Alyssa Blackwell DOB: Aug 05, 1986 MRN: [id number]  Date of admission: 01/19/2021 Delivery date:01/19/2021  Delivering provider: Dulce Sellar B  Date of discharge: 01/20/2021  Admitting diagnosis: Encounter for elective induction of labor [Z34.90] Intrauterine pregnancy: [redacted]w[redacted]d    Secondary diagnosis:  Active Problems:   History of gestational diabetes in prior pregnancy, currently pregnant   Language barrier   Tachycardia   GBS (group B Streptococcus carrier), +RV culture, currently pregnant  Additional problems: none    Discharge diagnosis: Term Pregnancy Delivered                                              Post partum procedures: none Augmentation: AROM Complications: None  Hospital course: Onset of Labor With Vaginal Delivery      35y.o. yo [id number] 33w4das admitted in Active Labor on 01/19/2021. [place] had [place] and despite trying to push through the lip there was no descent. She received an epidural and labored to complete and then pushed well to delivery. Of note, there was only time to receive 1 dose of PCN for GBS ppx.  Membrane Rupture Time/Date: 1:16 AM ,01/19/2021   Delivery Method:Vaginal, Spontaneous  Episiotomy: None  Lacerations:  None  Patient had an uncomplicated postpartum course. Labetalol was not continued while she was admitted and her heart rate remained within normal range. She is ambulating, tolerating a regular diet, passing flatus, and urinating well. Patient is discharged home in stable condition on 01/20/21.  Newborn Data: Birth date:01/19/2021  Birth time:5:18 AM  Gender:Female  Living status:Living  Apgars:8 ,9  Weight:3184 g (7lb 0.3oz)  Magnesium Sulfate received: No BMZ received: No Rhophylac:N/A MMR:N/A T-DaP:Given prenatally Flu: No Transfusion:No  Physical exam  Vitals:   01/19/21 1358  01/19/21 1823 01/19/21 2233 01/20/21 0555  BP: 121/69 118/80 124/75 119/74  Pulse: 72 91 81 80  Resp: _0 Temp: 98.4 F (36.9 C) 98.2 F (36.8 C) 97.7 F (36.5 C)   TempSrc: Oral Oral Oral   SpO2: 99% 99% 99%    General: alert, cooperative, and no distress Lochia: appropriate Uterine Fundus: firm Incision: N/A DVT Evaluation: No significant calf/ankle edema. Labs: Lab Results  Component Value Date   WBC 12.4 (H) 01/19/2021   HGB 13.0 01/19/2021   HCT 38.9 01/19/2021   MCV 95.1 01/19/2021   PLT 192 01/19/2021   No flowsheet data found. Edinburgh Score: Edinburgh Postnatal Depression Scale Screening Tool 01/19/2021  I have been able to laugh and see the funny side of things. 0  I have looked forward with enjoyment to things. 0  I have blamed myself unnecessarily when things went wrong. 0  I have been anxious or worried for no good reason. 0  I have felt scared or panicky for no good reason. 0  Things have been getting on top of me. 0  I have been so unhappy that I have had difficulty sleeping. 0  I have felt sad or miserable. 0  I have been so unhappy that I have been crying. 0  The thought of harming myself has occurred to me. 0  Edinburgh Postnatal Depression Scale Total 0     After visit meds:  Allergies as of  01/20/2021   No Known Allergies      Medication List     STOP taking these medications    labetalol 100 MG tablet Commonly known as: NORMODYNE       TAKE these medications    acetaminophen 325 MG tablet Commonly known as: Tylenol Take 2 tablets (650 mg total) by mouth every 4 (four) hours as needed (for pain scale < 4).   coconut oil Oil Apply 1 application topically as needed.   cyclobenzaprine 5 MG tablet Commonly known as: FLEXERIL Take 1 tablet (5 mg total) by mouth 3 (three) times daily as needed for muscle spasms.   hydroquinone 4 % cream daily in the afternoon.   ibuprofen 600 MG tablet Commonly known as: ADVIL Take 1  tablet (600 mg total) by mouth every 6 (six) hours.   PRENATAL VITAMIN PO Take by mouth.   Retin-A 0.1 % cream Generic drug: tretinoin Apply 1 application topically at bedtime.         Discharge home in stable condition Infant Feeding: Bottle and Breast Infant Disposition:home with mother Discharge instruction: per After Visit Summary and Postpartum booklet. Activity: Advance as tolerated. Pelvic rest for 6 weeks.  Diet: routine diet Future Appointments: Future Appointments  Date Time Provider [place]  01/25/2021  1:15 PM [facility]  01/25/2021  3:15 PM Aletha Halim, MD [facility]  01/30/2021  3:15 PM Tommie Raymond, NP [facility]   Follow up Visit:  Myrtis Ser, CNM  P [facility] Pool Please schedule this patient for Postpartum visit in: 4 weeks with the following provider: Any provider  In-Person  For C/S patients schedule nurse incision check in weeks 2 weeks: no  Low risk pregnancy complicated by: none  Delivery mode:  SVD  Anticipated Birth Control:  Depo (offered prior to d/c)  PP Procedures needed: none  Schedule Integrated BH visit: no  Labetalol was discontinued, ensure she has follow-up regarding her tachycardia.  01/20/2021 Hansel Feinstein, CNM  Attestation:  I confirm that I have verified the information documented in the resident's note and that I have also personally reperformed the physical exam and all medical decision making activities.  Patient was seen and examined by me also Agree with note Vitals stable Labs stable Fundus firm, lochia within normal limits Perineum healing Ext WNL Continue care Ready for discharge  Seabron Spates, CNM

## 2021-01-20 MED ORDER — IBUPROFEN 600 MG PO TABS: 600.0000 mg | ORAL_TABLET | Freq: Four times a day (QID) | ORAL | 0 refills | Status: DC

## 2021-01-20 MED ORDER — ACETAMINOPHEN 325 MG PO TABS: 650.0000 mg | ORAL_TABLET | ORAL | Status: DC | PRN

## 2021-01-20 MED ORDER — COCONUT OIL OIL: 1.0000 "application " | TOPICAL_OIL | 0 refills | Status: DC | PRN

## 2021-01-20 NOTE — Social Work (Signed)
CSW received consult for hx of panic attacks.  CSW met with MOB to offer support and complete assessment.     CSW utilized [employer] Dina #[id number]. CSW introduced self and role. CSW observed infant sleeping in bassinet. CSW informed MOB of reason for consult. MOB was receptive and pleasant during visit. CSW informed MOB of reason for consult and assessed current emotions. MOB smiled as she shared she is doing "very good and really happy." MOB reported she does not have a history of anxiety or panic attacks. MOB stated her son has a history of anxiety. MOB shared she had some worries during the pregnancy related to her heart, but otherwise had no additional concerns. MOB stated she does not any mental health diagnosis. MOB identified FOB as a support and denies any current SI, HI or DV.   CSW provided education regarding the baby blues period versus PPD. CSW provided the New Mom Checklist and encouraged MOB to self evaluate and contact a medical professional if symptoms are noted at any time. 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 MOB stated it lasted 6 months and she received therapy, which helped the symptoms resolve.    CSW provided review of Sudden Infant Death Syndrome (SIDS) precautions. MOB stated she has everything for infant, including a car seat. MOB identified [facility] for follow-up care and denies any barriers. MOB stated she has no additional needs at this time.  CSW identifies no further need for intervention and no barriers to discharge at this time.  Bristol Osentoski, LCSWA Clinical Social Work [facility] [phone]  

## 2021-01-23 ENCOUNTER — Telehealth: Payer: Self-pay

## 2021-01-23 NOTE — Telephone Encounter (Signed)
Transition Care Management Unsuccessful Follow-up Telephone Call  Date of discharge and from where:  01/20/2021 from [facility]  Attempts:  1st Attempt  Reason for unsuccessful TCM follow-up call:  Unable to leave message

## 2021-01-24 NOTE — Telephone Encounter (Signed)
Transition Care Management Unsuccessful Follow-up Telephone Call  Date of discharge and from where:  01/20/2021 from [facility]  Attempts:  2nd Attempt  Reason for unsuccessful TCM follow-up call:  Unable to leave message

## 2021-01-25 ENCOUNTER — Other Ambulatory Visit: Payer: Medicaid Other

## 2021-01-25 ENCOUNTER — Encounter: Payer: Medicaid Other | Admitting: Obstetrics and Gynecology

## 2021-01-25 NOTE — Telephone Encounter (Signed)
Transition Care Management Unsuccessful Follow-up Telephone Call  Date of discharge and from where:  01/20/2021 from [facility]  Attempts:  3rd Attempt  Reason for unsuccessful TCM follow-up call:  Unable to reach patient

## 2021-01-27 NOTE — Progress Notes (Deleted)
Cardiology Office Note   Date:  01/27/2021   ID:  Alyssa Blackwell, DOB 29-Jul-1986, MRN [id number]  PCP:  Catalina Antigua, MD  Cardiologist:  Dr. Eden Emms, MD  No chief complaint on file.     History of Present Illness: Alyssa Blackwell is a 35 y.o. female who presents for echocardiogram follow up, seen for Dr. Eden Emms.   Alyssa Blackwell was referred to cardiology from her OB/GYN for the evaluation of tachypalpitations which began around 20 weeks during her previous pregnancies. Patient is a G [id number] currently pregnant at [redacted] weeks.    On last office visit, she was maintaining NSR with a normal EKG however echocardiogram was recommended to follow-up for structural heart disease. PRN Inderal was discussed however not prescribed.  Echocardiogram was ordered 09/06/2020 however had not yet been performed.  Echocardiogram rescheduled for 01/09/2021 which showed an LVEF at 65 to 70% with no regional wall motion abnormalities, normal diastolic parameters, trivial MR and no other valvular disease.  In last follow-up, palpitations have become more frequent and more bothersome.  Plan at that time was to start labetalol for tachypalpitations.  Medication discussed with pharmacy team given current pregnancy.  She was therefore started on labetalol 100 mg twice daily.  It appears she was admitted for spontaneous rupture of membranes with uncomplicated postpartum course.  Per chart review, her labetalol was not continued at the time of discharge on 01/20/2021 as her heart rate remained stable and within normal range during that time.    Today,    1. Palpitations: -Reports that since she was last seen, her palpitations have become more frequent and more bothersome. As above, the echocardiogram has not yet been performed. We discussed that this will need to be obtained. I discussed medications options with our pharmacy team who recommend starting labetalol 100mg  BID for  tachypalpitations. I have reviewed that she will need to obtain a BP cuff for better monitoring of her BP. I have instructed her to not take the medication if her SBP is <95-100. She will call is BP goes too low or if she has any other side effects. -She is to follow up after echocardiogram has been performed         Past Medical History:  Diagnosis Date   Arrhythmia    occurs only in pregnancy, had evaluation in with 5th pregnancy, normal evaluation per pt   Gestational diabetes    preg #5   Gestational hypertension    Panic attacks     Past Surgical History:  Procedure Laterality Date   NO PAST SURGERIES       Current Outpatient Medications  Medication Sig Dispense Refill   acetaminophen (TYLENOL) 325 MG tablet Take 2 tablets (650 mg total) by mouth every 4 (four) hours as needed (for pain scale < 4).     coconut oil OIL Apply 1 application topically as needed.  0   cyclobenzaprine (FLEXERIL) 5 MG tablet Take 1 tablet (5 mg total) by mouth 3 (three) times daily as needed for muscle spasms. 20 tablet 0   hydroquinone 4 % cream daily in the afternoon.     ibuprofen (ADVIL) 600 MG tablet Take 1 tablet (600 mg total) by mouth every 6 (six) hours. 30 tablet 0   Prenatal Vit-Fe Fumarate-FA (PRENATAL VITAMIN PO) Take by mouth.     RETIN-A 0.1 % cream Apply 1 application topically at bedtime.     No current facility-administered medications for this  visit.    Allergies:   Patient has no known allergies.    Social History:  The patient  reports that she has never smoked. She has never used smokeless tobacco. She reports that she does not drink alcohol and does not use drugs.   Family History:  The patient's ***family history includes Healthy in her mother; Heart attack in her father; Heart disease in her father; Hypertension in her paternal grandmother.    ROS:  Please see the history of present illness.   Otherwise, review of systems are positive for {NONE  DEFAULTED:18576}.   All other systems are reviewed and negative.    PHYSICAL EXAM: VS:  LMP 05/01/2020  , BMI There is no height or weight on file to calculate BMI. GEN: Well nourished, well developed, in no acute distress HEENT: normal Neck: no JVD, carotid bruits, or masses Cardiac: ***RRR; no murmurs, rubs, or gallops,no edema  Respiratory:  clear to auscultation bilaterally, normal work of breathing GI: soft, nontender, nondistended, + BS MS: no deformity or atrophy Skin: warm and dry, no rash Neuro:  Strength and sensation are intact Psych: euthymic mood, full affect   EKG:  EKG {ACTION; IS/IS HLK:[id number]} ordered today. The ekg ordered today demonstrates ***   Recent Labs: 01/19/2021: Hemoglobin 13.0; Platelets 192    Lipid Panel No results found for: CHOL, TRIG, HDL, CHOLHDL, VLDL, LDLCALC, LDLDIRECT    Wt Readings from Last 3 Encounters:  01/18/21 190 lb 3.2 oz (86.3 kg)  01/12/21 188 lb 3.2 oz (85.4 kg)  01/11/21 188 lb 1.6 oz (85.3 kg)      Other studies Reviewed: Additional studies/ records that were reviewed today include: ***. Review of the above records demonstrates: ***   ASSESSMENT AND PLAN:  1.  ***   Current medicines are reviewed at length with the patient today.  The patient {ACTIONS; HAS/DOES NOT HAVE:19233} concerns regarding medicines.  The following changes have been made:  {PLAN; NO CHANGE:13088:s}  Labs/ tests ordered today include: *** No orders of the defined types were placed in this encounter.    Disposition:   FU with *** in {gen number 3-73:428768} {Days to years:10300}  Signed, Georgie Chard, NP  01/27/2021 12:44 PM    [facility] [address] [place], [place], [place]  [address] Phone: [phone]; Fax: [phone]

## 2021-01-30 ENCOUNTER — Inpatient Hospital Stay (HOSPITAL_COMMUNITY)
Admission: AD | Admit: 2021-01-30 | Payer: Medicaid Other | Source: Home / Self Care | Admitting: Obstetrics & Gynecology

## 2021-01-30 ENCOUNTER — Ambulatory Visit: Payer: Self-pay | Admitting: Cardiology

## 2021-01-30 ENCOUNTER — Inpatient Hospital Stay (HOSPITAL_COMMUNITY): Payer: Medicaid Other

## 2021-02-03 DIAGNOSIS — Z419 Encounter for procedure for purposes other than remedying health state, unspecified: Secondary | ICD-10-CM | POA: Diagnosis not present

## 2021-02-27 ENCOUNTER — Other Ambulatory Visit: Payer: Self-pay

## 2021-02-27 ENCOUNTER — Encounter: Payer: Self-pay | Admitting: Student

## 2021-02-27 ENCOUNTER — Ambulatory Visit (INDEPENDENT_AMBULATORY_CARE_PROVIDER_SITE_OTHER): Payer: Medicaid Other | Admitting: Student

## 2021-02-27 ENCOUNTER — Other Ambulatory Visit (HOSPITAL_COMMUNITY)
Admission: RE | Admit: 2021-02-27 | Discharge: 2021-02-27 | Disposition: A | Discharge status: Home / Self Care | Payer: Medicaid Other | Source: Ambulatory Visit | Attending: Student | Admitting: Student

## 2021-02-27 MED ORDER — CYCLOBENZAPRINE HCL 5 MG PO TABS: 5.0000 mg | ORAL_TABLET | Freq: Three times a day (TID) | ORAL | 0 refills | Status: DC | PRN

## 2021-02-27 NOTE — Progress Notes (Signed)
Post Partum Visit Note  Alyssa Blackwell Adamarie Izzo is a 35 y.o. G3T5176 female who presents for a postpartum visit. She is 5 weeks postpartum following a normal spontaneous vaginal delivery.  I have fully reviewed the prenatal and intrapartum course. The delivery was at 37.4 gestational weeks.  Anesthesia: epidural. Postpartum course has been complicated by constipation.  Baby is doing well. Baby is feeding by both breast and bottle - Gerber Gentle . Bleeding staining only. Bowel function is constipation.  Bladder function is normal. Patient is sexually active. Contraception method is Depo-Provera injections. Postpartum depression screening: negative.   The pregnancy intention screening data noted above was reviewed. Potential methods of contraception were discussed. The patient elected to proceed with No data recorded.   Edinburgh Postnatal Depression Scale - 02/27/21 1415       Edinburgh Postnatal Depression Scale:  In the Past 7 Days   I have been able to laugh and see the funny side of things. 0    I have looked forward with enjoyment to things. 0    I have blamed myself unnecessarily when things went wrong. 0    I have been anxious or worried for no good reason. 2    I have felt scared or panicky for no good reason. 2    Things have been getting on top of me. 0    I have been so unhappy that I have had difficulty sleeping. 0    I have felt sad or miserable. 0    I have been so unhappy that I have been crying. 0    The thought of harming myself has occurred to me. 0    Edinburgh Postnatal Depression Scale Total 4             Health Maintenance Due  Topic Date Due   PAP SMEAR-Modifier  Never done   COVID-19 Vaccine (2 - Pfizer series) 06/16/2020    The following portions of the patient's history were reviewed and updated as appropriate: allergies, current medications, past family history, past medical history, past social history, past surgical history, and problem  list.  Review of Systems Pertinent items are noted in HPI.  Objective:  BP 116/78   Pulse 90   Wt 177 lb (80.3 kg)   LMP 05/01/2020   Breastfeeding Yes   BMI 33.44 kg/m    General:  alert, cooperative, and no distress   Breasts:  Not done  Lungs: clear to auscultation bilaterally  Heart:  regular rate and rhythm, S1, S2 normal, no murmur, click, rub or gallop  Abdomen: soft, non-tender; bowel sounds normal; no masses,  no organomegaly   Wound NA  GU exam:  normal       Assessment:   1. Encounter for postpartum visit   2. Postpartum care and examination      Healthy postpartum exam.   Plan:   Essential components of care per ACOG recommendations:  1.  Mood and well being: Patient with negative depression screening today. Reviewed local resources for support.  - Patient tobacco use? No.   - hx of drug use? No.    2. Infant care and feeding:  -Patient currently breastmilk feeding? Yes. Discussed returning to work and pumping.  -Social determinants of health (SDOH) reviewed in EPIC. No concerns  3. Sexuality, contraception and birth spacing - Patient does not want a pregnancy in the next year.  Desired family size is 4 children.  - Reviewed forms of contraception in  tiered fashion. Patient desired Depo-Provera today.  She has fup appt scheduled for Sept 16 - Discussed birth spacing of 18 months  4. Sleep and fatigue -Encouraged family/partner/community support of 4 hrs of uninterrupted sleep to help with mood and fatigue  5. Physical Recovery  - Discussed patients delivery and complications. She describes her labor as mixed. - Patient had a Vaginal, no problems at delivery. Patient had a  no  laceration. Perineal healing reviewed. Patient expressed understanding - Patient has urinary incontinence? No. - Patient is safe to resume physical and sexual activity  6.  Health Maintenance - HM due items addressed : constipation - Last pap smear . Pap smear done at  today's visit.  -Breast Cancer screening indicated? No.  -Patient would like refill on her back pain medicine -Patient declines physical therapy -Patient would like colace for two weeks; she can purchase at pharmacy  7. Chronic Disease/Pregnancy Condition follow up: None -Patient is not taking her labetlol and BP is well-controlled; she does not need to be on any medicines.  - PCP follow up  Marylene Land, CNM [facility]

## 2021-02-27 NOTE — Patient Instructions (Signed)
-  Colace (docusate sodium) -1 capsula 3 veces al dia

## 2021-03-03 LAB — CYTOLOGY - PAP
Comment: NEGATIVE
Comment: NEGATIVE
Diagnosis: UNDETERMINED — AB
HPV 16: NEGATIVE
HPV 18 / 45: NEGATIVE
High risk HPV: POSITIVE — AB

## 2021-03-06 DIAGNOSIS — Z419 Encounter for procedure for purposes other than remedying health state, unspecified: Secondary | ICD-10-CM | POA: Diagnosis not present

## 2021-03-13 ENCOUNTER — Encounter: Payer: Self-pay | Admitting: Student

## 2021-03-13 ENCOUNTER — Telehealth: Payer: Self-pay

## 2021-03-13 DIAGNOSIS — R87810 Cervical high risk human papillomavirus (HPV) DNA test positive: Secondary | ICD-10-CM | POA: Insufficient documentation

## 2021-03-13 DIAGNOSIS — R87610 Atypical squamous cells of undetermined significance on cytologic smear of cervix (ASC-US): Secondary | ICD-10-CM | POA: Insufficient documentation

## 2021-03-13 NOTE — Telephone Encounter (Addendum)
-----   Message from Marylene Land, CNM sent at 03/13/2021 12:01 PM EDT ----- Hello! I tried to call this patient; she has a VM that has not been set up. She does not have MyChart. She needs colpo; do you mind to try to call her again and explain her results (ASCUS with High Risk HPV) and get her scheduled? She is PP and has Medicaid. Thank you so much!  Alyssa Blackwell pt with interpreter Debarah Crape. Reviewed PAP result of ASCUS and need for colposcopy. Pt states she is scared. Colposcopy procedure reviewed; pt advised to take Tylenol or ibuprofen prior to appt. Reviewed cervical cancer screening protocols. Call disconnected prior to letting pt know about positive HPV result. Attempted to call pt back x 2, no answer. Front office notified to schedule appt for colposcopy.

## 2021-04-06 DIAGNOSIS — Z419 Encounter for procedure for purposes other than remedying health state, unspecified: Secondary | ICD-10-CM | POA: Diagnosis not present

## 2021-04-17 ENCOUNTER — Emergency Department (HOSPITAL_COMMUNITY)
Admission: EM | Admit: 2021-04-17 | Discharge: 2021-04-18 | Disposition: A | Discharge status: Home / Self Care | Payer: Medicaid Other | Attending: Emergency Medicine | Admitting: Emergency Medicine

## 2021-04-17 ENCOUNTER — Encounter (HOSPITAL_COMMUNITY): Payer: Self-pay

## 2021-04-17 ENCOUNTER — Emergency Department (HOSPITAL_COMMUNITY): Payer: Medicaid Other

## 2021-04-17 ENCOUNTER — Other Ambulatory Visit: Payer: Self-pay

## 2021-04-17 DIAGNOSIS — S199XXA Unspecified injury of neck, initial encounter: Secondary | ICD-10-CM | POA: Diagnosis not present

## 2021-04-17 DIAGNOSIS — S060X9A Concussion with loss of consciousness of unspecified duration, initial encounter: Secondary | ICD-10-CM | POA: Diagnosis not present

## 2021-04-17 DIAGNOSIS — Y92410 Unspecified street and highway as the place of occurrence of the external cause: Secondary | ICD-10-CM | POA: Diagnosis not present

## 2021-04-17 DIAGNOSIS — Z743 Need for continuous supervision: Secondary | ICD-10-CM | POA: Diagnosis not present

## 2021-04-17 DIAGNOSIS — S161XXA Strain of muscle, fascia and tendon at neck level, initial encounter: Secondary | ICD-10-CM | POA: Diagnosis not present

## 2021-04-17 DIAGNOSIS — N9489 Other specified conditions associated with female genital organs and menstrual cycle: Secondary | ICD-10-CM | POA: Insufficient documentation

## 2021-04-17 DIAGNOSIS — S0990XA Unspecified injury of head, initial encounter: Secondary | ICD-10-CM | POA: Diagnosis not present

## 2021-04-17 DIAGNOSIS — T148XXA Other injury of unspecified body region, initial encounter: Secondary | ICD-10-CM | POA: Diagnosis not present

## 2021-04-17 LAB — I-STAT BETA HCG BLOOD, ED (MC, WL, AP ONLY): I-stat hCG, quantitative: <5 m[IU]/mL (ref <5)
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
## 2021-04-18 MED ORDER — ACETAMINOPHEN 325 MG PO TABS
650.0000 mg | ORAL_TABLET | Freq: Once | ORAL | Status: AC
  Administered 2021-04-18: 650 mg via ORAL
  Filled 2021-04-18 (×2): qty 2

## 2021-04-18 MED ORDER — METHOCARBAMOL 500 MG PO TABS: 500.0000 mg | ORAL_TABLET | Freq: Two times a day (BID) | ORAL | 0 refills | Status: DC | PRN

## 2021-04-18 MED ORDER — KETOROLAC TROMETHAMINE 60 MG/2ML IM SOLN
60.0000 mg | Freq: Once | INTRAMUSCULAR | Status: AC
  Administered 2021-04-18: 60 mg via INTRAMUSCULAR
  Filled 2021-04-18: qty 2

## 2021-04-18 MED ORDER — NAPROXEN 500 MG PO TABS: 500.0000 mg | ORAL_TABLET | Freq: Two times a day (BID) | ORAL | 0 refills | Status: DC

## 2021-04-18 NOTE — ED Notes (Signed)
Patient called for medication.  No answer in waiting room.
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
## 2021-04-18 NOTE — Discharge Instructions (Addendum)
You may take naproxen and methocarbamol as needed for pain and muscle spasms, warm compresses, return for worsening symptoms, you will likely have symptoms for the better part of 2 weeks

## 2021-04-19 ENCOUNTER — Telehealth: Payer: Self-pay

## 2021-04-19 ENCOUNTER — Other Ambulatory Visit (HOSPITAL_COMMUNITY)
Admission: RE | Admit: 2021-04-19 | Discharge: 2021-04-19 | Disposition: A | Discharge status: Home / Self Care | Payer: Medicaid Other | Source: Ambulatory Visit | Attending: Family Medicine | Admitting: Family Medicine

## 2021-04-19 ENCOUNTER — Encounter: Payer: Self-pay | Admitting: Family Medicine

## 2021-04-19 ENCOUNTER — Ambulatory Visit (INDEPENDENT_AMBULATORY_CARE_PROVIDER_SITE_OTHER): Payer: Medicaid Other | Admitting: Family Medicine

## 2021-04-19 ENCOUNTER — Other Ambulatory Visit: Payer: Self-pay

## 2021-04-19 VITALS — BP 129/86 | HR 85

## 2021-04-19 DIAGNOSIS — R87810 Cervical high risk human papillomavirus (HPV) DNA test positive: Secondary | ICD-10-CM | POA: Insufficient documentation

## 2021-04-19 DIAGNOSIS — R87610 Atypical squamous cells of undetermined significance on cytologic smear of cervix (ASC-US): Secondary | ICD-10-CM | POA: Diagnosis not present

## 2021-04-19 DIAGNOSIS — N870 Mild cervical dysplasia: Secondary | ICD-10-CM | POA: Diagnosis not present

## 2021-04-19 LAB — POCT PREGNANCY, URINE: Preg Test, Ur: NEGATIVE

## 2021-04-19 NOTE — Progress Notes (Signed)
    GYNECOLOGY OFFICE COLPOSCOPY PROCEDURE NOTE  35 y.o. [id number] here for colposcopy for ASCUS with POSITIVE high risk HPV pap smear on 02/27/2021. Discussed role for HPV in cervical dysplasia, need for surveillance.  Patient gave informed written consent, time out was performed.  Placed in lithotomy position. Cervix viewed with speculum and colposcope after application of acetic acid.   Colposcopy adequate? Yes  acetowhite lesion(s) noted at entire SCJ. 2,4,8,10 o'clock biopsies obtained.  ECC specimen obtained. All specimens were labeled and sent to pathology.  Patient was given post procedure instructions.  Will follow up pathology and manage accordingly; patient will be contacted with results and recommendations.  Routine preventative health maintenance measures emphasized.   Reva Bores, MD 04/19/2021 3:42 PM

## 2021-04-19 NOTE — Telephone Encounter (Signed)
Transition Care Management Unsuccessful Follow-up Telephone Call  Date of discharge and from where:  04/18/2021-[facility]   Attempts:  1st Attempt  Reason for unsuccessful TCM follow-up call:  Unable to leave message

## 2021-04-20 NOTE — Telephone Encounter (Signed)
Transition Care Management Unsuccessful Follow-up Telephone Call  Date of discharge and from where:  04/18/2021 from [facility]  Attempts:  2nd Attempt  Reason for unsuccessful TCM follow-up call:  Unable to leave message    

## 2021-04-21 LAB — SURGICAL PATHOLOGY

## 2021-04-21 NOTE — Telephone Encounter (Signed)
Transition Care Management Unsuccessful Follow-up Telephone Call  Date of discharge and from where:  04/18/2021-[facility]   Attempts:  3rd Attempt  Reason for unsuccessful TCM follow-up call:  Unable to leave message

## 2021-05-06 ENCOUNTER — Encounter: Payer: Self-pay | Admitting: Radiology

## 2021-05-06 DIAGNOSIS — Z419 Encounter for procedure for purposes other than remedying health state, unspecified: Secondary | ICD-10-CM | POA: Diagnosis not present

## 2021-05-08 ENCOUNTER — Telehealth: Payer: Self-pay | Admitting: General Practice

## 2021-05-08 NOTE — Telephone Encounter (Signed)
Patient called into front office requesting a call back with her colposcopy results.   Called patient back with Raquel assisting for spanish interpretation. Explained patient's colposcopy results to her and discussed follow up recommendations. Patient verbalized understanding.

## 2021-06-06 DIAGNOSIS — Z419 Encounter for procedure for purposes other than remedying health state, unspecified: Secondary | ICD-10-CM | POA: Diagnosis not present

## 2021-06-22 ENCOUNTER — Ambulatory Visit: Payer: Self-pay | Admitting: *Deleted

## 2021-06-22 NOTE — Telephone Encounter (Signed)
Pt reports dysuria, frequency and urgency x 5 days. States urine cloudy, darker than usual. Denies fever. Rates pain with urination 7/10. Also report flank pain x 3 days, right sided. States H/O UTIs. Pt has established care at [facility], has not been seen yet. Advised UC. States she cannot go today as she has no child care. Advised to go ASAP. Also advised to alert OB/GYN she last saw 8/22 as she may be able to see pt. CAre advise given per protocol. Pt verbalizes understanding. Assisted by Audree Camel [phone]      Reason for Disposition  All other patients with painful urination (Exception: [1] EITHER frequency or urgency AND [2] has on-call doctor)  Side (flank) or lower back pain present  Answer Assessment - Initial Assessment Questions 1. SEVERITY: "How bad is the pain?"  (e.g., Scale 1-10; mild, moderate, or severe)   - MILD (1-3): complains slightly about urination hurting   - MODERATE (4-7): interferes with normal activities     - SEVERE (8-10): excruciating, unwilling or unable to urinate because of the pain      7/10 2. FREQUENCY: "How many times have you had painful urination today?"      All day 3. PATTERN: "Is pain present every time you urinate or just sometimes?"      With urinating 4. ONSET: "When did the painful urination start?"      5 days ago, worsening 5. FEVER: "Do you have a fever?" If Yes, ask: "What is your temperature, how was it measured, and when did it start?"     no 6. PAST UTI: "Have you had a urine infection before?" If Yes, ask: "When was the last time?" and "What happened that time?"      yes 7. CAUSE: "What do you think is causing the painful urination?"  (e.g., UTI, scratch, Herpes sore)     UTI 8. OTHER SYMPTOMS: "Do you have any other symptoms?" (e.g., flank pain, vaginal discharge, genital sores, urgency, blood in urine)     Right sided back painX 3 days.  Protocols used: Urination Pain - Female-A-AH

## 2021-06-22 NOTE — Telephone Encounter (Signed)
Pt called to establish care at [facility] /after scheduling appt pt stated she is experiencing Pain with urination/ please advise   Attempted to reach pt, VM not set up.  Assisted by Jeronimo Norma [phone]

## 2021-07-06 DIAGNOSIS — Z419 Encounter for procedure for purposes other than remedying health state, unspecified: Secondary | ICD-10-CM | POA: Diagnosis not present

## 2021-08-06 DIAGNOSIS — Z419 Encounter for procedure for purposes other than remedying health state, unspecified: Secondary | ICD-10-CM | POA: Diagnosis not present

## 2021-08-09 DIAGNOSIS — Z3200 Encounter for pregnancy test, result unknown: Secondary | ICD-10-CM | POA: Diagnosis not present

## 2021-08-09 DIAGNOSIS — Z30013 Encounter for initial prescription of injectable contraceptive: Secondary | ICD-10-CM | POA: Diagnosis not present

## 2021-08-09 DIAGNOSIS — Z113 Encounter for screening for infections with a predominantly sexual mode of transmission: Secondary | ICD-10-CM | POA: Diagnosis not present

## 2021-08-14 ENCOUNTER — Other Ambulatory Visit: Payer: Self-pay

## 2021-08-14 ENCOUNTER — Ambulatory Visit: Payer: Medicaid Other | Attending: Family Medicine | Admitting: Family Medicine

## 2021-08-14 ENCOUNTER — Encounter: Payer: Self-pay | Admitting: Family Medicine

## 2021-08-14 VITALS — BP 112/78 | HR 83 | Ht 61.0 in | Wt 188.4 lb

## 2021-08-14 DIAGNOSIS — Z131 Encounter for screening for diabetes mellitus: Secondary | ICD-10-CM | POA: Diagnosis not present

## 2021-08-14 DIAGNOSIS — H539 Unspecified visual disturbance: Secondary | ICD-10-CM

## 2021-08-14 DIAGNOSIS — G5603 Carpal tunnel syndrome, bilateral upper limbs: Secondary | ICD-10-CM | POA: Diagnosis not present

## 2021-08-14 DIAGNOSIS — Z13228 Encounter for screening for other metabolic disorders: Secondary | ICD-10-CM | POA: Diagnosis not present

## 2021-08-14 LAB — POCT GLYCOSYLATED HEMOGLOBIN (HGB A1C): Hemoglobin A1C: 5.5 % (ref 4.0–5.6)

## 2021-08-14 MED ORDER — PREDNISONE 20 MG PO TABS: 20.0000 mg | ORAL_TABLET | Freq: Every day | ORAL | 0 refills | Status: DC

## 2021-08-14 NOTE — Progress Notes (Signed)
Subjective:  Patient ID: Alyssa Blackwell, female    DOB: [birth year]  Age: 36 y.o. MRN: [id number]  CC: New Patient (Initial Visit)   HPI Alyssa Blackwell is a 36 y.o. year old female who presents today to establish care.  Interval History: Sh complains of a 1 year history of numbness in her hands and she is unable to hold something something with an elevated hand as her whole hand gets numb. She has to wake up at night to shake her hands and she complains in the mornings she has to rub her forearms for relief. She paints houses for a living.  She also complains when she drives at night everything is blurry and she has associated tearing. During the day her vision is good. Past Medical History:  Diagnosis Date   Arrhythmia    occurs only in pregnancy, had evaluation in [place] with 5th pregnancy, normal evaluation per pt   Gestational diabetes    preg #5   Gestational hypertension    Panic attacks     Past Surgical History:  Procedure Laterality Date   NO PAST SURGERIES      Family History  Problem Relation Age of Onset   Heart attack Father    Heart disease Father    Hypertension Paternal Grandmother    Healthy Mother     No Known Allergies  Outpatient Medications Prior to Visit  Medication Sig Dispense Refill   cyclobenzaprine (FLEXERIL) 5 MG tablet Take 1 tablet (5 mg total) by mouth 3 (three) times daily as needed for muscle spasms. 20 tablet 0   hydroquinone 4 % cream daily in the afternoon.     RETIN-A 0.1 % cream Apply 1 application topically at bedtime.     acetaminophen (TYLENOL) 325 MG tablet Take 2 tablets (650 mg total) by mouth every 4 (four) hours as needed (for pain scale < 4). (Patient not taking: Reported on 08/14/2021)     coconut oil OIL Apply 1 application topically as needed. (Patient not taking: Reported on 02/27/2021)  0   ibuprofen (ADVIL) 600 MG tablet Take 1 tablet (600 mg total) by mouth every 6 (six) hours. (Patient not  taking: Reported on 02/27/2021) 30 tablet 0   methocarbamol (ROBAXIN) 500 MG tablet Take 1 tablet (500 mg total) by mouth 2 (two) times daily as needed for muscle spasms. (Patient not taking: Reported on 08/14/2021) 20 tablet 0   naproxen (NAPROSYN) 500 MG tablet Take 1 tablet (500 mg total) by mouth 2 (two) times daily with a meal. (Patient not taking: Reported on 08/14/2021) 30 tablet 0   Prenatal Vit-Fe Fumarate-FA (PRENATAL VITAMIN PO) Take by mouth. (Patient not taking: Reported on 08/14/2021)     No facility-administered medications prior to visit.     ROS Review of Systems  Constitutional:  Negative for activity change, appetite change and fatigue.  HENT:  Negative for congestion, sinus pressure and sore throat.   Eyes:  Negative for visual disturbance.  Respiratory:  Negative for cough, chest tightness, shortness of breath and wheezing.   Cardiovascular:  Negative for chest pain and palpitations.  Gastrointestinal:  Negative for abdominal distention, abdominal pain and constipation.  Endocrine: Negative for polydipsia.  Genitourinary:  Negative for dysuria and frequency.  Musculoskeletal:  Negative for arthralgias and back pain.  Skin:  Negative for rash.  Neurological:  Positive for numbness. Negative for tremors and light-headedness.  Hematological:  Does not bruise/bleed easily.  Psychiatric/Behavioral:  Negative for agitation and  behavioral problems.    Objective:  BP 112/78    Pulse 83    Ht 5\' 1"  (1.549 m)    Wt 188 lb 6.4 oz (85.5 kg)    SpO2 99%    BMI 35.60 kg/m   BP/Weight 08/14/2021 04/19/2021 04/18/2021  Systolic BP 112 129 118  Diastolic BP 78 86 79  Wt. (Lbs) 188.4 - -  BMI 35.6 - -      Physical Exam Constitutional:      Appearance: She is well-developed.  Cardiovascular:     Rate and Rhythm: Normal rate.     Heart sounds: Normal heart sounds. No murmur heard. Pulmonary:     Effort: Pulmonary effort is normal.     Breath sounds: Normal breath sounds. No  wheezing or rales.  Chest:     Chest wall: No tenderness.  Abdominal:     General: Bowel sounds are normal. There is no distension.     Palpations: Abdomen is soft. There is no mass.     Tenderness: There is no abdominal tenderness.  Musculoskeletal:        General: Normal range of motion.     Right lower leg: No edema.     Left lower leg: No edema.     Comments: Positive Phalen sign, positive Tinel sign  Neurological:     Mental Status: She is alert and oriented to person, place, and time.  Psychiatric:        Mood and Affect: Mood normal.    No flowsheet data found.  Lipid Panel  No results found for: CHOL, TRIG, HDL, CHOLHDL, VLDL, LDLCALC, LDLDIRECT  CBC    Component Value Date/Time   WBC 12.4 (H) 01/19/2021 0128   RBC 4.09 01/19/2021 0128   HGB 13.0 01/19/2021 0128   HGB 12.0 11/11/2020 0831   HCT 38.9 01/19/2021 0128   HCT 36.2 11/11/2020 0831   PLT 192 01/19/2021 0128   PLT 186 11/11/2020 0831   MCV 95.1 01/19/2021 0128   MCV 96 11/11/2020 0831   MCH 31.8 01/19/2021 0128   MCHC 33.4 01/19/2021 0128   RDW 13.2 01/19/2021 0128   RDW 12.3 11/11/2020 0831   LYMPHSABS 2.5 08/12/2020 1127   EOSABS 0.1 08/12/2020 1127   BASOSABS 0.1 08/12/2020 1127    Lab Results  Component Value Date   HGBA1C 5.5 08/14/2021    Vision Screening   Right eye Left eye Both eyes  Without correction 20/20 20/20 20/20   With correction       Assessment & Plan:  1. Bilateral carpal tunnel syndrome Symptoms are severe and she will benefit from cortisone injections Meanwhile advised to use bilateral wrist braces and I will place her on a short course of prednisone - predniSONE (DELTASONE) 20 MG tablet; Take 1 tablet (20 mg total) by mouth daily with breakfast.  Dispense: 5 tablet; Refill: 0 - AMB referral to orthopedics  2. Screening for diabetes mellitus Screening for diabetes due to presence of paresthesia but A1c is normal at 5.1 - POCT glycosylated hemoglobin (Hb A1C)  3.  Vision abnormalities Vision is 20/20 bilaterally - Ambulatory referral to Ophthalmology  4. Screening for metabolic disorder - Basic Metabolic Panel - LP+Non-HDL Cholesterol    Meds ordered this encounter  Medications   predniSONE (DELTASONE) 20 MG tablet    Sig: Take 1 tablet (20 mg total) by mouth daily with breakfast.    Dispense:  5 tablet    Refill:  0    Follow-up: Return  in about 6 months (around 02/11/2022) for Chronic medical conditions.       Charlott Rakes, MD, FAAFP. [facility] [place], [place]   08/14/2021, 11:40 AM

## 2021-08-14 NOTE — Progress Notes (Signed)
Needs referral to eye doctor, states can not see well at night. Sunlight hurt eyes.  Fingertips and hand goes numb in the AM.

## 2021-08-14 NOTE — Patient Instructions (Signed)
Sndrome del tnel carpiano Carpal Tunnel Syndrome  El sndrome del tnel carpiano es una afeccin que causa dolor, adormecimiento y debilidad en la mano y los dedos. El tnel carpiano es un rea estrecha ubicada en el lado palmar de la mueca. Los movimientos repetidos de la mueca o determinadas enfermedades pueden causar la hinchazn del tnel. Esta hinchazn comprime el nervio principal de la mueca. El nervio principal de lamueca se llama "nervio mediano". Cules son las causas? Esta afeccin puede ser causada por lo siguiente: Movimientos repetidos y enrgicos de la mueca y la mano. Lesiones en la mueca. Artritis. Un quiste o un tumor en el tnel carpiano. Acumulacin de lquido durante el embarazo. Uso de herramientas que vibran. A veces, se desconoce la causa de esta afeccin. Qu incrementa el riesgo? Los siguientes factores pueden hacer que sea ms propenso a desarrollar esta afeccin: Tener un trabajo que requiera que mueva la mueca o la mano repetitivamente o enrgicamente o que utilice herramientas que vibran. Estos pueden ser, entre otros, los trabajos que implican usar una computadora, trabajar en una lnea de ensamblaje o trabajar con herramientas elctricas como taladros y lijadoras. Ser mujer. Tener ciertas afecciones, tales como: Diabetes. Obesidad. Tiroides hipoactiva (hipotiroidismo). Insuficiencia renal. Artritis reumatoide. Cules son los signos o sntomas? Los sntomas de esta afeccin incluyen: Sensacin de hormigueo en los dedos de la mano, especialmente el pulgar, el ndice y el dedo medio. Hormigueo o adormecimiento en la mano. Sensacin de dolor en todo el brazo, especialmente cuando la mueca y el codo estn flexionados durante mucho tiempo. Dolor en la mueca que sube por el brazo hasta el hombro. Dolor que baja hasta la palma de la mano o los dedos. Sensacin de debilidad en las manos. Tal vez tenga dificultad para tomar y sostener objetos. Los  sntomas pueden empeorar durante la noche. Cmo se diagnostica? Esta afeccin se diagnostica mediante los antecedentes mdicos y un examen fsico. Tambin pueden hacerle estudios, que incluyen los siguientes: Un electromiograma (EMG). Esta prueba mide las seales elctricas que los nervios les envan a los msculos. Estudio de conduccin nerviosa. Este estudio permite determinar si las seales elctricas pasan correctamente por los nervios. Estudios de diagnstico por imgenes, como radiografas, una ecografa y una resonancia magntica (RM). Estos estudios permiten detectar las posibles causas de la afeccin. Cmo se trata? El tratamiento de esta afeccin puede incluir: Cambios en el estilo de vida. Es importante que deje o cambie la actividad que caus la afeccin. Hacer ejercicio y actividades para fortalecer y estirar los msculos y los tendones (fisioterapia). Hacer cambios en el estilo de vida que lo ayuden con su afeccin y aprender a realizar sus actividades diarias de forma segura (terapia ocupacional). Analgsicos y antiinflamatorios. Esto puede incluir medicamentos que se inyectan en la mueca. Una frula o un dispositivo ortopdico para la mueca. Ciruga. Siga estas instrucciones en su casa: Si tiene una frula o un dispositivo ortopdico: Use la frula o el dispositivo ortopdico como se lo haya indicado el mdico. Quteselos solamente como se lo haya indicado el mdico. Afloje la frula o el dispositivo ortopdico si los dedos de las manos se le adormecen, siente hormigueos o se le enfran y se tornan de color azul. Mantenga la frula o el dispositivo ortopdico limpios. Si la frula o el dispositivo ortopdico no son impermeables: No deje que se mojen. Cbralos con un envoltorio hermtico cuando tome un bao de inmersin o una ducha. Control del dolor, la rigidez y la hinchazn Si se lo indican,   aplique hielo sobre la zona dolorida. Para hacer esto: Si tiene una frula o un  dispositivo ortopdico desmontable, quteselos como se lo haya indicado el mdico. Ponga el hielo en una bolsa plstica. Coloque una toalla entre la piel y la bolsa, o entre la frula o dispositivo ortopdico y la bolsa. Aplique el hielo durante 20 minutos, 2 o 3 veces por da. No se quede dormido con la bolsa de hielo sobre la piel. Retire el hielo si la piel se pone de color rojo brillante. Esto es muy importante. Si no puede sentir dolor, calor o fro, tiene un mayor riesgo de que se dae la zona. Mueva los dedos con frecuencia para reducir la rigidez y la hinchazn. Instrucciones generales Use los medicamentos de venta libre y los recetados solamente como se lo haya indicado el mdico. Descanse la mueca y la mano de toda actividad que le cause dolor. Si la afeccin tiene relacin con el trabajo, hable con su empleador sobre los cambios que pueden hacerse, por ejemplo, usar una almohadilla para apoyar la mueca mientras tipea. Haga los ejercicios como se lo hayan indicado el mdico, el fisioterapeuta o el terapeuta ocupacional. Cumpla con todas las visitas de seguimiento. Esto es importante. Comunquese con un mdico si: Aparecen nuevos sntomas. El dolor no se alivia con los medicamentos. Sus sntomas empeoran. Solicite ayuda de inmediato si: Tiene hormigueo o adormecimiento intensos en la mueca o la mano. Resumen El sndrome del tnel carpiano es una afeccin que causa dolor, adormecimiento y debilidad en la mano y los dedos. Generalmente se debe a movimientos repetidos de la mueca. El sndrome del tnel carpiano se trata mediante cambios en el estilo de vida y medicamentos. Tambin puede indicarse la ciruga. Siga las instrucciones del mdico sobre el uso de una frula, el descanso de la actividad, la asistencia a las visitas de seguimiento y llamar para pedir ayuda. Esta informacin no tiene como fin reemplazar el consejo del mdico. Asegresede hacerle al mdico cualquier pregunta  que tenga. Document Revised: 01/08/2020 Document Reviewed: 01/08/2020 Elsevier Patient Education  2022 Elsevier Inc.  

## 2021-08-15 LAB — BASIC METABOLIC PANEL
BUN/Creatinine Ratio: 17 (ref 9–23)
BUN: 10 mg/dL (ref 6–20)
CO2: 22 mmol/L (ref 20–29)
Calcium: 9.4 mg/dL (ref 8.7–10.2)
Chloride: 104 mmol/L (ref 96–106)
Creatinine, Ser: 0.6 mg/dL (ref 0.57–1.00)
Glucose: 93 mg/dL (ref 70–99)
Potassium: 4.6 mmol/L (ref 3.5–5.2)
Sodium: 139 mmol/L (ref 134–144)
eGFR: 121 mL/min/{1.73_m2} (ref >59)

## 2021-08-15 LAB — LP+NON-HDL CHOLESTEROL
Cholesterol, Total: 152 mg/dL (ref 100–199)
HDL: 43 mg/dL (ref >39)
LDL Chol Calc (NIH): 92 mg/dL (ref 0–99)
Total Non-HDL-Chol (LDL+VLDL): 109 mg/dL (ref 0–129)
Triglycerides: 87 mg/dL (ref 0–149)
VLDL Cholesterol Cal: 17 mg/dL (ref 5–40)

## 2021-08-18 ENCOUNTER — Telehealth: Payer: Self-pay

## 2021-08-18 NOTE — Telephone Encounter (Signed)
-----   Message from Charlott Rakes, MD sent at 08/15/2021  7:33 AM EST ----- Please inform the patient that labs are normal. Thank you.

## 2021-08-18 NOTE — Telephone Encounter (Signed)
Patient name and DOB has been verified Patient was informed of lab results. Patient had no questions.  

## 2021-08-24 ENCOUNTER — Ambulatory Visit: Payer: Medicaid Other | Admitting: Orthopedic Surgery

## 2021-08-29 ENCOUNTER — Ambulatory Visit: Payer: Medicaid Other | Admitting: Orthopedic Surgery

## 2021-09-06 DIAGNOSIS — Z419 Encounter for procedure for purposes other than remedying health state, unspecified: Secondary | ICD-10-CM | POA: Diagnosis not present

## 2021-10-04 DIAGNOSIS — Z419 Encounter for procedure for purposes other than remedying health state, unspecified: Secondary | ICD-10-CM | POA: Diagnosis not present

## 2021-10-05 ENCOUNTER — Ambulatory Visit (INDEPENDENT_AMBULATORY_CARE_PROVIDER_SITE_OTHER): Payer: Medicaid Other | Admitting: Orthopedic Surgery

## 2021-10-05 ENCOUNTER — Encounter: Payer: Self-pay | Admitting: Orthopedic Surgery

## 2021-10-05 ENCOUNTER — Other Ambulatory Visit: Payer: Self-pay

## 2021-10-05 DIAGNOSIS — G5601 Carpal tunnel syndrome, right upper limb: Secondary | ICD-10-CM | POA: Diagnosis not present

## 2021-10-05 DIAGNOSIS — G5602 Carpal tunnel syndrome, left upper limb: Secondary | ICD-10-CM | POA: Diagnosis not present

## 2021-10-05 DIAGNOSIS — R200 Anesthesia of skin: Secondary | ICD-10-CM | POA: Diagnosis not present

## 2021-10-05 NOTE — Progress Notes (Signed)
? ?Office Visit Note ?  ?Patient: Alyssa Blackwell           ?Date of Birth: May 04, 1986           ?MRN: [id number] ?Visit Date: 10/05/2021 ?             ?Requested by: Hoy Register, MD ?[address] [place] ?[place],  [place] [address] ?PCP: Catalina Antigua, MD ? ? ?Assessment & Plan: ?Visit Diagnoses:  ?1. Bilateral hand numbness   ? ? ?Plan: Discussed with patient that her symptoms and exam findings seem most consistent with bilateral carpal tunnel syndrome.  Given her significant and nightly nocturnal symptoms, we will try wrist braces at night.  I would also like to get electrodiagnostic studies to further evaluate her symptoms.  I can see her back once the study is complete.  ? ?Follow-Up Instructions: No follow-ups on file.  ? ?Orders:  ?No orders of the defined types were placed in this encounter. ? ?No orders of the defined types were placed in this encounter. ? ? ? ? Procedures: ?No procedures performed ? ? ?Clinical Data: ?No additional findings. ? ? ?Subjective: ?Chief Complaint  ?Patient presents with  ? Right Hand - Weakness, Numbness  ?  RIGHT Handed, +n/t, swelling, weakness, pain 7/10,   ? Left Hand - Weakness, Numbness  ? ? ?Presents with this is a 36 year old right-hand-dominant female who presents with numbness and tingling of both hands.  Is been going on for less than a year now.  She describes numbness and tingling in all of her fingers but most frequently the middle and ring fingers.  She wakes up with symptoms every night.  She has to shake her hands for symptom relief.  She notes that her hands go numb when doing certain activities such as braiding her daughter's hair.  She also notes that her hands feel heavy and that she is been dropping things unintentionally.  She has never worn any braces.  She has not had any other treatment. ? ?She has no history of diabetes, hypothyroidism, cervical spine problems, wrist trauma, or inflammatory arthropathy. ? ?She has never had any  electrodiagnostic studies.  ? ?Weakness ?Associated symptoms include weakness.  ? ?Review of Systems  ?Neurological:  Positive for weakness.  ? ? ?Objective: ?Vital Signs: BP 105/74 (BP Location: Left Arm, Patient Position: Sitting)   Pulse 73   Ht 5\' 1"  (1.549 m)   Wt 188 lb (85.3 kg)   BMI 35.52 kg/m?  ? ?Physical Exam ?Constitutional:   ?   Appearance: Normal appearance.  ?Cardiovascular:  ?   Rate and Rhythm: Normal rate.  ?   Pulses: Normal pulses.  ?Pulmonary:  ?   Effort: Pulmonary effort is normal.  ?Skin: ?   General: Skin is warm and dry.  ?   Capillary Refill: Capillary refill takes less than 2 seconds.  ?Neurological:  ?   Mental Status: She is alert.  ? ? ?Right Hand Exam  ? ?Tenderness  ?The patient is experiencing no tenderness.  ? ?Range of Motion  ?The patient has normal right wrist ROM.  ? ?Other  ?Erythema: absent ?Sensation: normal ?Pulse: present ? ?Comments:  + Phalen and Tinel at wrist.  Negative Tinel at elbow.  5/5 thenar motor strength without atrophy.  ? ? ?Left Hand Exam  ? ?Tenderness  ?The patient is experiencing no tenderness.  ? ?Range of Motion  ?The patient has normal left wrist ROM. ? ?Other  ?Erythema: absent ?Sensation: normal ?  Pulse: present ? ?Comments:  + Phalen and Tinel at wrist.  Negative Tinel at elbow.  5/5 thenar motor strength without atrophy.  ? ? ? ? ?Specialty Comments:  ?No specialty comments available. ? ?Imaging: ?No results found. ? ? ?PMFS History: ?Patient Active Problem List  ? Diagnosis Date Noted  ? Bilateral hand numbness 10/05/2021  ? ASCUS with positive high risk HPV cervical 03/13/2021  ? Tachycardia 12/29/2020  ? Vulvovaginitis 10/10/2020  ? Language barrier 10/10/2020  ? Dental abscess 10/10/2020  ? History of gestational diabetes in prior pregnancy, currently pregnant 08/12/2020  ? ?Past Medical History:  ?Diagnosis Date  ? Arrhythmia   ? occurs only in pregnancy, had evaluation in [place] with 5th pregnancy, normal evaluation per pt  ? Gestational  diabetes   ? preg #5  ? Gestational hypertension   ? Panic attacks   ?  ?Family History  ?Problem Relation Age of Onset  ? Heart attack Father   ? Heart disease Father   ? Hypertension Paternal Grandmother   ? Healthy Mother   ?  ?Past Surgical History:  ?Procedure Laterality Date  ? NO PAST SURGERIES    ? ?Social History  ? ?Occupational History  ? 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  ?Tobacco Use  ? Smoking status: Never  ? Smokeless tobacco: Never  ?Vaping Use  ? Vaping Use: Never used  ?Substance and Sexual Activity  ? Alcohol use: Never  ? Drug use: Never  ? Sexual activity: Yes  ?  Birth control/protection: None  ? ? ? ? ? ? ?

## 2021-10-05 NOTE — Addendum Note (Signed)
Addended by: Marlyne Beards on: 10/05/2021 03:15 PM ? ? Modules accepted: Orders ? ?

## 2021-10-18 ENCOUNTER — Telehealth: Payer: Self-pay

## 2021-10-18 NOTE — Telephone Encounter (Signed)
Patient called and wanted a return call to set up an apt  with Dr. Ernestina Patches.  ? ?Please advise  ?

## 2021-10-19 NOTE — Telephone Encounter (Signed)
Called patient to schedule appt. She would like to come in on  Tuesday 3/28 @ 10:30 ?

## 2021-10-20 ENCOUNTER — Ambulatory Visit: Payer: Self-pay | Admitting: *Deleted

## 2021-10-20 NOTE — Telephone Encounter (Signed)
Reason for Disposition ? SEVERE (e.g., excruciating) throat pain ?   Swollen and sore tonsil ? ?Answer Assessment - Initial Assessment Questions ?1. ONSET: "When did the throat start hurting?" (Hours or days ago)  ?    She has a sore throat.   Started with a cold 2 wks ago then getting a lot of congestion.  Mucus is green and yellow.   Monday I got pain in my tonsils and it hurts to swallow. ?I took Nyquil at night and Theraflu and my throat is still sore. ?I do have green mucus. ?2. SEVERITY: "How bad is the sore throat?" (Scale 1-10; mild, moderate or severe) ?  - MILD (1-3):  doesn't interfere with eating or normal activities ?  - MODERATE (4-7): interferes with eating some solids and normal activities ?  - SEVERE (8-10):  excruciating pain, interferes with most normal activities ?  - SEVERE DYSPHAGIA: can't swallow liquids, drooling ?    *No Answer* ?3. STREP EXPOSURE: "Has there been any exposure to strep within the past week?" If Yes, ask: "What type of contact occurred?"  ?    No ?4.  VIRAL SYMPTOMS: "Are there any symptoms of a cold, such as a runny nose, cough, hoarse voice or red eyes?"  ?    Yes a cold 2 wks ago.   Have a lot of congestion. ?5. FEVER: "Do you have a fever?" If Yes, ask: "What is your temperature, how was it measured, and when did it start?" ?    No ?6. PUS ON THE TONSILS: "Is there pus on the tonsils in the back of your throat?" ?    Yes   ?7. OTHER SYMPTOMS: "Do you have any other symptoms?" (e.g., difficulty breathing, headache, rash) ?    *No Answer* ?8. PREGNANCY: "Is there any chance you are pregnant?" "When was your last menstrual period?" ?    *No Answer* ? ?Protocols used: Sore Throat-A-AH ? ?Chief Complaint: Sore throat, swollen tonsil making it difficult to swallow due to the pain ?Symptoms: above ?Frequency: Started with a cold 2 weeks ago but the sore throat is getting worse ?Pertinent Negatives: Patient denies trouble talking. ?Disposition: [] ED /[] Urgent Care (no appt  availability in office) / [] Appointment(In office/virtual)/ [x]  [facility]/ [] Home Care/ [] Refused Recommended Disposition /[] [facility]/ []  Follow-up with PCP ?Additional Notes: Offered a [facility] virtual care appt. Which she is agreeable to doing.   She did not need me to help her schedule the visit.   She said she knew how to do it herself.   No appts available at [facility].   Did a Teams message to [facility] but no response.   ?She called in using Spanish interpreter Maybeth [phone]. ?

## 2021-10-23 NOTE — Telephone Encounter (Signed)
Attempt to call patient with interpreter on the phone to schedule apt today.  ?Phone call was disconnected by party.  ?Attempted to call patient back using interpreter line and office line.  ? ?No answer. Unable to leave voicemail.  ?

## 2021-10-23 NOTE — Telephone Encounter (Signed)
See on schedule where patient has appointment scheduled.  ?

## 2021-10-24 ENCOUNTER — Other Ambulatory Visit: Payer: Self-pay

## 2021-10-24 ENCOUNTER — Ambulatory Visit (INDEPENDENT_AMBULATORY_CARE_PROVIDER_SITE_OTHER): Payer: Medicaid Other | Admitting: Physician Assistant

## 2021-10-24 ENCOUNTER — Encounter: Payer: Self-pay | Admitting: Physician Assistant

## 2021-10-24 DIAGNOSIS — J029 Acute pharyngitis, unspecified: Secondary | ICD-10-CM | POA: Diagnosis not present

## 2021-10-24 MED ORDER — AMOXICILLIN 500 MG PO CAPS: 500.0000 mg | ORAL_CAPSULE | Freq: Two times a day (BID) | ORAL | 0 refills | Status: AC

## 2021-10-24 NOTE — Progress Notes (Signed)
Medical Assistant used [employer] to contact patient.  ?Interpreter Name: Albin Felling Interpreter #: [id number] ?Patient verified DOB ?Patient has not taken medication today and patient reports it is painful to eat. ?Patient reports she has a sore throat. Patient states concern began with a cold 2 wks ago then getting a lot of congestion. Mucus is green and yellow. Monday patient began having pain in my tonsils and it hurts to swallow. ?I took Nyquil at night and Theraflu and my throat is still sore. ? ?

## 2021-10-24 NOTE — Patient Instructions (Signed)
You are gong to take amoxicillin twice a day for 10 days.  ? ?I hope that you fell better soon ? ?Roney Jaffe, PA-C ?Physician Assistant ?[facility] ?[email or web address] ? ? ?Faringitis ?Pharyngitis ?La faringitis es inflamaci?n de la garganta (faringe). Es Neomia Dear causa muy com?n de dolor de Advertising copywriter. La faringitis puede ser causada por una bacteria, pero por lo general la provoca un virus. La mayor?a de los casos de faringitis se curan sin tratamiento. ??Cu?les son las causas? ?Esta afecci?n puede ser causada por lo siguiente: ?Infecci?n por virus (viral). La faringitis viral se contagia f?cilmente de [place] persona a otra (es contagiosa) al toser, estornudar y compartir objetos o utensilios personales como tazas, tenedores, cucharas, cepillos de dientes. ?Infecci?n por bacterias (bacteriana). La faringitis bacteriana se puede contagiar al tocarse la nariz o cara luego de entrar en contacto con las bacterias, o a trav?s de un contacto cercano, como por ejemplo, al besarse. ?Alergias. Las alergias pueden causar una acumulaci?n de mucosidad en la garganta (goteo posnasal) que deriva en la inflamaci?n e irritaci?n. A su vez, las alergias pueden bloquear las fosas nasales, lo cual hace que se deba respirar por la boca, y esto seca e Insurance claims handler. ??Qu? incrementa el riesgo? ?Es m?s probable que contraiga esta afecci?n si: ?Tiene entre 5 y 24 a?os. ?Est? en lugares muy concurridos, tales como guarder?a, escuela o vivir en una residencia estudiantil. ?Vive en un ambiente de clima fr?o. ?Tiene debilitado el sistema que combate las enfermedades (inmunitario). ??Cu?les son los signos o s?ntomas? ?Los s?ntomas de esta afecci?n var?an seg?n la causa. Los s?ntomas frecuentes de esta afecci?n incluyen los siguientes: ?Dolor de Advertising copywriter. ?Fatiga. ?Fiebre no muy alta. ?Nariz tapada (congesti?n nasal) y tos. ?Dolor de [place]. ?Otros s?ntomas pueden incluir lo  siguiente: ?Ganglios en el cuello (ganglios linf?ticos) que est?n hinchados. ?Erupciones cut?neas. ?Pel?cula parecida a las placas en la garganta o am?gdalas. Por lo general, esto es un s?ntoma de faringitis bacteriana. ?V?mitos. ?Ojos rojos con picaz?n (conjuntivitis). ?P?rdida del apetito. ?Dolores musculares y en las articulaciones. ?Am?gdalas agrandadas. ??C?mo se diagnostica? ?Esta afecci?n se puede diagnosticar en funci?n de los antecedentes m?dicos y un examen f?sico. El m?dico le har? preguntas sobre la enfermedad y sus s?ntomas. ?Puede que se haga un cultivo de su garganta para buscar bacterias (prueba r?pida para estreptococos). Tambi?n es posible que se realicen otros an?lisis de laboratorio, seg?n la posible causa, aunque esto es poco com?n. ??C?mo se trata? ?Muchas veces, no se requiere tratamiento para esta afecci?n. La faringitis generalmente mejora en 3 o 4 d?as sin tratamiento. ?La faringitis bacteriana puede tratarse con antibi?ticos. ?Siga estas instrucciones en su casa: ?Medicamentos ?Use los medicamentos de venta libre y los recetados solamente como se lo haya indicado el m?dico. ?Si le recetaron un antibi?tico, t?melo como se lo haya indicado el m?dico. No deje de tomar el antibi?tico, aunque comience a sentirse mejor. ?Use aerosoles para Conservation officer, nature como se lo haya indicado el m?dico. ?Los ni?os pueden contraer faringitis. No le d? aspirina al ni?o por el riesgo de que contraiga el s?ndrome de [place]. ?Control del dolor ?Para ayudar a Engineer, materials, intente lo siguiente: ?Beba l?quidos calientes, como caldos, infusiones o agua caliente. ?Tambi?n puede comer o beber l?quidos fr?os o congelados, tales como paletas de hielo congelado. ?Haga g?rgaras con Burlene Arnt de agua y sal 3 o 4 veces al d?a, o cuando sea necesario. Para preparar agua con sal, disuelva totalmente de ? a 1 cucharadita (de 3  a 6 g) de sal en 1 taza (237 ml) de agua tibia. ?Chupe caramelos duros o pastillas para la  garganta. ?Ponga un humidificador de vapor fr?o en la habitaci?n por la noche para Haematologist. ?Tambi?n puede abrir el agua caliente de la ducha y sentarse en el ba?o con la puerta cerrada durante 5 a 10 minutos. ? ?Instrucciones generales ? ?No consuma ning?n producto que contenga nicotina o tabaco. Estos productos incluyen cigarrillos, tabaco para mascar y aparatos de vapeo, como los cigarrillos electr?nicos. Si necesita ayuda para dejar de fumar, consulte al m?dico. ?Haga reposo como se lo haya indicado el m?dico. ?Product manager suficiente l?quido como para mantener la orina de color amarillo p?lido. ??C?mo se evita? ?Para ayudar a evitar infectarse o que se propague la infecci?n: ?L?vese las manos frecuentemente con agua y jab?n durante al menos 20 segundos. Use desinfectante para manos si no dispone de agua y jab?n. ?No se toque los ojos, la nariz o la boca sin haberse [employer], y l?vese las manos despu?s de tocarse estas zonas. ?No comparta vasos ni utensilios para comer. ?Evite el contacto cercano con personas que est?n enfermas. ?Comun?quese con un m?dico si: ?Tiene bultos grandes y dolorosos en el cuello. ?Tiene una erupci?n cut?nea. ?Tose con mucosidad de color verde, amarillo amarronado o con [place]. ?Solicite ayuda de inmediato si: ?El cuello se pone r?gido. ?Comienza a babear o no puede tragar l?quidos. ?No puede beber ni tomar medicamentos sin vomitar. ?Siente un dolor intenso que no se va, incluso luego de [employer]. ?Tiene dificultades para respirar, y no a causa de la congesti?n nasal. ?Experimenta un nuevo dolor e hinchaz?n de las articulaciones como las rodillas, tobillos, mu?ecas o codos. ?Estos s?ntomas pueden representar un problema grave que constituye Radio broadcast assistant. No espere a ver si los s?ntomas desaparecen. Solicite atenci?n m?dica de inmediato. Comun?quese con el servicio de emergencias de su localidad (911 en los [place]). No conduzca por sus propios medios  [employer]. ?Resumen ?La faringitis ocurre cuando hay enrojecimiento, dolor e hinchaz?n (inflamaci?n) en la garganta (faringe). ?Si bien la faringitis puede ser causada por una bacteria, la causa m?s com?n son los virus. ?[employer] de faringitis se curan sin tratamiento. ?La faringitis bacteriana se trata con antibi?ticos. ?Esta informaci?n no tiene Theme park manager el consejo del m?dico. Aseg?rese de hacerle al m?dico cualquier pregunta que tenga. ?Document Revised: 11/17/2020 Document Reviewed: 11/17/2020 ?Elsevier Patient Education ? 2022 Elsevier Inc. ? ?

## 2021-10-24 NOTE — Progress Notes (Signed)
? ?Established Patient Office Visit ? ?Subjective:  ?Patient ID: Alyssa Blackwell, female    DOB: 1985-09-23  Age: 36 y.o. MRN: [id number] ? ?CC:  ?Chief Complaint  ?Patient presents with  ? Sore Throat  ? ?Virtual Visit via Telephone Note ? ?I connected with Alyssa Blackwell on 10/24/21 at  8:00 AM EDT by telephone and verified that I am speaking with the correct person using two identifiers. ? ?Location: ?Patient: Home  ?Provider:  ?  ?I discussed the limitations, risks, security and privacy concerns of performing an evaluation and management service by telephone and the availability of in person appointments. I also discussed with the patient that there may be a patient responsible charge related to this service. The patient expressed understanding and agreed to proceed. ? ? ?History of Present Illness: ? ?Alyssa Blackwell reports that she started feeling poorly about 3 weeks ago, states that she has been having a lot of congestion, cough with green sputum, nasal congestion with green discharge, states that she started having a severe sore throat about 5 days ago, states it is hard to swallow.  ? ?Eating and drinking okay, but with pain.  ? ?States that she has tried Nyquil and theraflu without relief. ? ?States that her 34 year old was being treated for pneumonia in the hospital, is unsure of other sick contacts.  States that she took home Covid test which was negative.  ? ?Due to language barrier, an interpreter was present during the history-taking and subsequent discussion (and for part of the physical exam) with this patient. ? ?Observations/Objective: ?Medical history and current medications reviewed, no physical exam completed ? ? ?Past Medical History:  ?Diagnosis Date  ? Arrhythmia   ? occurs only in pregnancy, had evaluation in [place] with 5th pregnancy, normal evaluation per pt  ? Gestational diabetes   ? preg #5  ? Gestational hypertension   ? Panic attacks    ? ? ?Past Surgical History:  ?Procedure Laterality Date  ? NO PAST SURGERIES    ? ? ?Family History  ?Problem Relation Age of Onset  ? Heart attack Father   ? Heart disease Father   ? Hypertension Paternal Grandmother   ? Healthy Mother   ? ? ?Social History  ? ?Socioeconomic History  ? Marital status: Married  ?  Spouse name: Not on file  ? Number of children: Not on file  ? Years of education: Not on file  ? Highest education level: Not on file  ?Occupational History  ? 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  ?Tobacco Use  ? Smoking status: Never  ? Smokeless tobacco: Never  ?Vaping Use  ? Vaping Use: Never used  ?Substance and Sexual Activity  ? Alcohol use: Never  ? Drug use: Never  ? Sexual activity: Yes  ?  Birth control/protection: None  ?Other Topics Concern  ? Not on file  ?Social History Narrative  ? Not on file  ? ?Social Determinants of Health  ? ?Financial Resource Strain: Not on file  ?Food Insecurity: Food Insecurity Present  ? Worried About Charity fundraiser in the Last Year: Sometimes true  ? Ran Out of Food in the Last Year: Sometimes true  ?Transportation Needs: No Transportation Needs  ? Lack of Transportation (Medical): No  ? Lack of Transportation (Non-Medical): No  ?Physical Activity: Not on file  ?Stress: Not on file  ?Social Connections: Not on file  ?Intimate Partner Violence: Not on file  ? ? ?Outpatient Medications  Prior to Visit  ?Medication Sig Dispense Refill  ? acetaminophen (TYLENOL) 325 MG tablet Take 2 tablets (650 mg total) by mouth every 4 (four) hours as needed (for pain scale < 4). (Patient not taking: Reported on 08/14/2021)    ? coconut oil OIL Apply 1 application topically as needed. (Patient not taking: Reported on 02/27/2021)  0  ? cyclobenzaprine (FLEXERIL) 5 MG tablet Take 1 tablet (5 mg total) by mouth 3 (three) times daily as needed for muscle spasms. 20 tablet 0  ? hydroquinone 4 % cream daily in the afternoon.    ? ibuprofen (ADVIL) 600 MG tablet Take 1 tablet (600 mg total) by  mouth every 6 (six) hours. (Patient not taking: Reported on 02/27/2021) 30 tablet 0  ? methocarbamol (ROBAXIN) 500 MG tablet Take 1 tablet (500 mg total) by mouth 2 (two) times daily as needed for muscle spasms. (Patient not taking: Reported on 08/14/2021) 20 tablet 0  ? naproxen (NAPROSYN) 500 MG tablet Take 1 tablet (500 mg total) by mouth 2 (two) times daily with a meal. (Patient not taking: Reported on 08/14/2021) 30 tablet 0  ? predniSONE (DELTASONE) 20 MG tablet Take 1 tablet (20 mg total) by mouth daily with breakfast. 5 tablet 0  ? Prenatal Vit-Fe Fumarate-FA (PRENATAL VITAMIN PO) Take by mouth. (Patient not taking: Reported on 08/14/2021)    ? RETIN-A 0.1 % cream Apply 1 application topically at bedtime.    ? ?No facility-administered medications prior to visit.  ? ? ?No Known Allergies ? ?ROS ?Review of Systems  ?Constitutional:  Negative for chills and fever.  ?HENT:  Positive for congestion, rhinorrhea, sinus pressure, sore throat and trouble swallowing. Negative for ear pain.   ?Eyes: Negative.   ?Respiratory:  Positive for cough. Negative for shortness of breath and wheezing.   ?Cardiovascular:  Negative for chest pain.  ?Gastrointestinal: Negative.   ?Endocrine: Negative.   ?Genitourinary: Negative.   ?Musculoskeletal: Negative.   ?Skin: Negative.   ?Allergic/Immunologic: Negative.   ?Neurological: Negative.   ?Hematological: Negative.   ?Psychiatric/Behavioral: Negative.    ? ?  ?Objective:  ?  ? ? ?There were no vitals taken for this visit. ?Wt Readings from Last 3 Encounters:  ?10/05/21 188 lb (85.3 kg)  ?08/14/21 188 lb 6.4 oz (85.5 kg)  ?04/17/21 176 lb (79.8 kg)  ? ? ? ?Health Maintenance Due  ?Topic Date Due  ? COVID-19 Vaccine (2 - Pfizer series) 06/16/2020  ? ? ?There are no preventive care reminders to display for this patient. ? ?No results found for: TSH ?Lab Results  ?Component Value Date  ? WBC 12.4 (H) 01/19/2021  ? HGB 13.0 01/19/2021  ? HCT 38.9 01/19/2021  ? MCV 95.1 01/19/2021  ? PLT 192  01/19/2021  ? ?Lab Results  ?Component Value Date  ? NA 139 08/14/2021  ? K 4.6 08/14/2021  ? CO2 22 08/14/2021  ? GLUCOSE 93 08/14/2021  ? BUN 10 08/14/2021  ? CREATININE 0.60 08/14/2021  ? CALCIUM 9.4 08/14/2021  ? EGFR 121 08/14/2021  ? ?Lab Results  ?Component Value Date  ? CHOL 152 08/14/2021  ? ?Lab Results  ?Component Value Date  ? HDL 43 08/14/2021  ? ?Lab Results  ?Component Value Date  ? Junction 92 08/14/2021  ? ?Lab Results  ?Component Value Date  ? TRIG 87 08/14/2021  ? ?No results found for: CHOLHDL ?Lab Results  ?Component Value Date  ? HGBA1C 5.5 08/14/2021  ? ? ?  ?Assessment & Plan:  ? ?  Problem List Items Addressed This Visit   ?None ?Visit Diagnoses   ? ? Acute pharyngitis, unspecified etiology    -  Primary  ? Relevant Medications  ? amoxicillin (AMOXIL) 500 MG capsule  ? ?  ? ? ?Meds ordered this encounter  ?Medications  ? amoxicillin (AMOXIL) 500 MG capsule  ?  Sig: Take 1 capsule (500 mg total) by mouth 2 (two) times daily for 10 days.  ?  Dispense:  20 capsule  ?  Refill:  0  ?  Order Specific Question:   Supervising Provider  ?  Answer:   Elsie Stain [[id number]]  ? ?Assessment and Plan: ?1. Acute pharyngitis, unspecified etiology ?Trial amoxicillin, patient has not used antibiotics in the past 30 days.  Patient education given on supportive care, red flags given for prompt reevaluation. ?- amoxicillin (AMOXIL) 500 MG capsule; Take 1 capsule (500 mg total) by mouth 2 (two) times daily for 10 days.  Dispense: 20 capsule; Refill: 0 ? ? ?Follow Up Instructions: ? ?  ?I discussed the assessment and treatment plan with the patient. The patient was provided an opportunity to ask questions and all were answered. The patient agreed with the plan and demonstrated an understanding of the instructions. ?  ?The patient was advised to call back or seek an in-person evaluation if the symptoms worsen or if the condition fails to improve as anticipated. ? ?I provided 14 minutes of non-face-to-face time  during this encounter. ? ? ? ? ? ?Follow-up: Return if symptoms worsen or fail to improve.  ? ? ?Soffia Doshier S Mayers, PA-C ?

## 2021-11-04 DIAGNOSIS — Z419 Encounter for procedure for purposes other than remedying health state, unspecified: Secondary | ICD-10-CM | POA: Diagnosis not present

## 2021-11-07 ENCOUNTER — Encounter: Payer: Self-pay | Admitting: Physical Medicine and Rehabilitation

## 2021-11-07 ENCOUNTER — Ambulatory Visit (INDEPENDENT_AMBULATORY_CARE_PROVIDER_SITE_OTHER): Payer: Medicaid Other | Admitting: Physical Medicine and Rehabilitation

## 2021-11-07 DIAGNOSIS — R202 Paresthesia of skin: Secondary | ICD-10-CM | POA: Diagnosis not present

## 2021-11-07 NOTE — Progress Notes (Signed)
Pt state she right handed ? ?Numeric Pain Rating Scale and Functional Assessment ?Average Pain 7 ? ? ?In the last MONTH (on 0-10 scale) has pain interfered with the following? ? ?1. General activity like being  able to carry out your everyday physical activities such as walking, climbing stairs, carrying groceries, or moving a chair?  ?Rating(9) ? ? ?-BT, -Dye Allergies. ? ?

## 2021-11-15 ENCOUNTER — Ambulatory Visit: Payer: Medicaid Other | Admitting: Physician Assistant

## 2021-11-15 NOTE — Procedures (Signed)
EMG & NCV Findings: ?Evaluation of the left median (across palm) sensory nerve showed prolonged distal peak latency (Wrist, 3.7 ms).  The right median (across palm) sensory nerve showed prolonged distal peak latency (Wrist, 3.9 ms) and prolonged distal peak latency (Palm, 11.8 ms).  All remaining nerves (as indicated in the following tables) were within normal limits.  All left vs. right side differences were within normal limits.   ? ?All examined muscles (as indicated in the following table) showed no evidence of electrical instability.   ? 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  ? ?There is no significant electrodiagnostic evidence of any other focal nerve entrapment, brachial plexopathy or cervical radiculopathy.  ? ?Recommendations: ?1.  Follow-up with referring physician. ?2.  Continue current management of symptoms. ? ?___________________________ ?Laurence Spates FAAPMR ?Board Certified, Tax adviser of Physical Medicine and Rehabilitation ? ? ? ?Nerve Conduction Studies ?Anti Sensory Summary Table ? ? Stim Site NR Peak (ms) Norm Peak (ms) P-T Amp (?V) Norm P-T Amp Site1 Site2 Delta-P (ms) Dist (cm) Vel (m/s) Norm Vel (m/s)  ?Left Median Acr Palm Anti Sensory (2nd Digit)  30?C  ?Wrist    *3.7 <3.6 48.0 >10 Wrist Palm 1.9 0.0    ?Palm    1.8 <2.0 63.0         ?Right Median Acr Palm Anti Sensory (2nd Digit)  30.4?C  ?Wrist    *3.9 <3.6 39.7 >10 Wrist Palm 7.9 0.0    ?Palm    *11.8 <2.0 5.4         ?Right Radial Anti Sensory (Base 1st Digit)  30.7?C  ?Wrist    2.0 <3.1 43.3  Wrist Base 1st Digit 2.0 0.0    ?Right Ulnar Anti Sensory (5th Digit)  30.9?C  ?Wrist    2.9 <3.7 31.1 >15.0 Wrist 5th Digit 2.9 14.0 48 >38  ? ?Motor Summary Table ? ? Stim Site NR Onset (ms) Norm Onset (ms) O-P Amp (mV) Norm O-P Amp Site1 Site2 Delta-0 (ms) Dist (cm) Vel (m/s) Norm Vel  (m/s)  ?Left Median Motor (Abd Poll Brev)  30.5?C  ?Wrist    3.5 <4.2 7.4 >5 Elbow Wrist 3.4 19.5 57 >50  ?Elbow    6.9  7.3         ?Right Median Motor (Abd Poll Brev)  30.8?C  ?Wrist    3.8 <4.2 6.2 >5 Elbow Wrist 3.4 18.5 54 >50  ?Elbow    7.2  5.1         ?Right Ulnar Motor (Abd Dig Min)  30.6?C  ?Wrist    2.7 <4.2 12.8 >3 B Elbow Wrist 2.8 19.0 68 >53  ?B Elbow    5.5  11.9  A Elbow B Elbow 1.1 10.0 91 >53  ?A Elbow    6.6  11.3         ? ?EMG ? ? Side Muscle Nerve Root Ins Act Fibs Psw Amp Dur Poly Recrt Int Fraser Din Comment  ?Right Abd Poll Brev Median C8-T1 Nml Nml Nml Nml Nml 0 Nml Nml   ?Right 1stDorInt Ulnar C8-T1 Nml Nml Nml Nml Nml 0 Nml Nml   ? ? ?Nerve Conduction Studies ?Anti Sensory Left/Right Comparison ? ? Stim Site L Lat (ms) R Lat (ms) L-R Lat (ms) L Amp (?V) R Amp (?V) L-R Amp (%) Site1 Site2 L Vel (m/s) R Vel (m/s) L-R Vel (m/s)  ?  Median Acr Palm Anti Sensory (2nd Digit)  30?C  ?Wrist *3.7 *3.9 0.2 48.0 39.7 17.3 Wrist Palm     ?Palm 1.8 *11.8 10.0 63.0 5.4 91.4       ?Radial Anti Sensory (Base 1st Digit)  30.7?C  ?Wrist  2.0   43.3  Wrist Base 1st Digit     ?Ulnar Anti Sensory (5th Digit)  30.9?C  ?Wrist  2.9   31.1  Wrist 5th Digit  48   ? ?Motor Left/Right Comparison ? ? Stim Site L Lat (ms) R Lat (ms) L-R Lat (ms) L Amp (mV) R Amp (mV) L-R Amp (%) Site1 Site2 L Vel (m/s) R Vel (m/s) L-R Vel (m/s)  ?Median Motor (Abd Poll Brev)  30.5?C  ?Wrist 3.5 3.8 0.3 7.4 6.2 16.2 Elbow Wrist 57 54 3  ?Elbow 6.9 7.2 0.3 7.3 5.1 30.1       ?Ulnar Motor (Abd Dig Min)  30.6?C  ?Wrist  2.7   12.8  B Elbow Wrist  68   ?B Elbow  5.5   11.9  A Elbow B Elbow  91   ?A Elbow  6.6   11.3        ? ? ? ?Waveforms: ?    ? ?    ? ?  ? ?

## 2021-11-15 NOTE — Progress Notes (Signed)
? ?Alyssa Blackwell - 36 y.o. female MRN [id number]  Date of birth: [birth year] ? ?Office Visit Note: ?Visit Date: 11/07/2021 ?PCP: Catalina Antiguaonstant, Peggy, MD ?Referred by: Marlyne BeardsBenfield, Charlie, MD ? ?Subjective: ?Chief Complaint  ?Patient presents with  ? Right Hand - Numbness, Pain  ? Left Hand - Numbness, Pain  ? Right Elbow - Pain  ? Left Elbow - Pain  ? Right Arm - Pain  ? Left Arm - Pain  ? ?HPI:  Alyssa Blackwell is a 36 y.o. female who comes in today at the request of Dr. Marlyne Beardsharlie Benfield for electrodiagnostic study of the Bilateral upper extremities.  Patient is Right hand dominant. She endorses numbness and heavniess in both hands, elbow and arms. Pt state she has problems holding things and opening doors. 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  ? ?ROS Otherwise per HPI. ? ?Assessment & Plan: ?Visit Diagnoses:  ?  ICD-10-CM   ?1. Paresthesia of skin  R20.2 NCV with EMG (electromyography)  ?  ?  ?Plan: Impression: ?The above electrodiagnostic study is ABNORMAL and reveals evidence of a mild bilateral median nerve entrapment at the wrist (carpal tunnel syndrome) affecting sensory components.  ? ?There is no significant electrodiagnostic evidence of any other focal nerve entrapment, brachial plexopathy or cervical radiculopathy.  ? ?Recommendations: ?1.  Follow-up with referring physician. ?2.  Continue current management of symptoms. ? ?Meds & Orders: No orders of the defined types were placed in this encounter. ?  ?Orders Placed This Encounter  ?Procedures  ? NCV with EMG (electromyography)  ?  ?Follow-up: No follow-ups on file.  ? ?Procedures: ?No procedures performed  ?EMG & NCV Findings: ?Evaluation of the left median (across palm) sensory nerve showed prolonged distal peak latency (Wrist, 3.7 ms).  The right median (across palm) sensory nerve showed prolonged distal peak latency (Wrist, 3.9 ms) and prolonged distal peak latency (Palm, 11.8 ms).  All remaining  nerves (as indicated in the following tables) were within normal limits.  All left vs. right side differences were within normal limits.   ? ?All examined muscles (as indicated in the following table) showed no evidence of electrical instability.   ? 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  ? ?There is no significant electrodiagnostic evidence of any other focal nerve entrapment, brachial plexopathy or cervical radiculopathy.  ? ?Recommendations: ?1.  Follow-up with referring physician. ?2.  Continue current management of symptoms. ? ?___________________________ ?Naaman PlummerFred Hyden Soley FAAPMR ?Board Certified, Biomedical engineerAmerican Board of Physical Medicine and Rehabilitation ? ? ? ?Nerve Conduction Studies ?Anti Sensory Summary Table ? ? Stim Site NR Peak (ms) Norm Peak (ms) P-T Amp (?V) Norm P-T Amp Site1 Site2 Delta-P (ms) Dist (cm) Vel (m/s) Norm Vel (m/s)  ?Left Median Acr Palm Anti Sensory (2nd Digit)  30?C  ?Wrist    *3.7 <3.6 48.0 >10 Wrist Palm 1.9 0.0    ?Palm    1.8 <2.0 63.0         ?Right Median Acr Palm Anti Sensory (2nd Digit)  30.4?C  ?Wrist    *3.9 <3.6 39.7 >10 Wrist Palm 7.9 0.0    ?Palm    *11.8 <2.0 5.4         ?Right Radial Anti Sensory (Base 1st Digit)  30.7?C  ?Wrist    2.0 <3.1 43.3  Wrist Base 1st Digit 2.0 0.0    ?  Right Ulnar Anti Sensory (5th Digit)  30.9?C  ?Wrist    2.9 <3.7 31.1 >15.0 Wrist 5th Digit 2.9 14.0 48 >38  ? ?Motor Summary Table ? ? Stim Site NR Onset (ms) Norm Onset (ms) O-P Amp (mV) Norm O-P Amp Site1 Site2 Delta-0 (ms) Dist (cm) Vel (m/s) Norm Vel (m/s)  ?Left Median Motor (Abd Poll Brev)  30.5?C  ?Wrist    3.5 <4.2 7.4 >5 Elbow Wrist 3.4 19.5 57 >50  ?Elbow    6.9  7.3         ?Right Median Motor (Abd Poll Brev)  30.8?C  ?Wrist    3.8 <4.2 6.2 >5 Elbow Wrist 3.4 18.5 54 >50  ?Elbow    7.2  5.1         ?Right Ulnar Motor (Abd Dig Min)  30.6?C   ?Wrist    2.7 <4.2 12.8 >3 B Elbow Wrist 2.8 19.0 68 >53  ?B Elbow    5.5  11.9  A Elbow B Elbow 1.1 10.0 91 >53  ?A Elbow    6.6  11.3         ? ?EMG ? ? Side Muscle Nerve Root Ins Act Fibs Psw Amp Dur Poly Recrt Int Dennie Bible Comment  ?Right Abd Poll Brev Median C8-T1 Nml Nml Nml Nml Nml 0 Nml Nml   ?Right 1stDorInt Ulnar C8-T1 Nml Nml Nml Nml Nml 0 Nml Nml   ? ? ?Nerve Conduction Studies ?Anti Sensory Left/Right Comparison ? ? Stim Site L Lat (ms) R Lat (ms) L-R Lat (ms) L Amp (?V) R Amp (?V) L-R Amp (%) Site1 Site2 L Vel (m/s) R Vel (m/s) L-R Vel (m/s)  ?Median Acr Palm Anti Sensory (2nd Digit)  30?C  ?Wrist *3.7 *3.9 0.2 48.0 39.7 17.3 Wrist Palm     ?Palm 1.8 *11.8 10.0 63.0 5.4 91.4       ?Radial Anti Sensory (Base 1st Digit)  30.7?C  ?Wrist  2.0   43.3  Wrist Base 1st Digit     ?Ulnar Anti Sensory (5th Digit)  30.9?C  ?Wrist  2.9   31.1  Wrist 5th Digit  48   ? ?Motor Left/Right Comparison ? ? Stim Site L Lat (ms) R Lat (ms) L-R Lat (ms) L Amp (mV) R Amp (mV) L-R Amp (%) Site1 Site2 L Vel (m/s) R Vel (m/s) L-R Vel (m/s)  ?Median Motor (Abd Poll Brev)  30.5?C  ?Wrist 3.5 3.8 0.3 7.4 6.2 16.2 Elbow Wrist 57 54 3  ?Elbow 6.9 7.2 0.3 7.3 5.1 30.1       ?Ulnar Motor (Abd Dig Min)  30.6?C  ?Wrist  2.7   12.8  B Elbow Wrist  68   ?B Elbow  5.5   11.9  A Elbow B Elbow  91   ?A Elbow  6.6   11.3        ? ? ? ?Waveforms: ?    ? ?    ? ?  ?  ? ?Clinical History: ?No specialty comments available.  ? ? ? ?Objective:  VS:  HT:    WT:   BMI:     BP:   HR: bpm  TEMP: ( )  RESP:  ?Physical Exam ?Musculoskeletal:     ?   General: No swelling, tenderness or deformity.  ?   Comments: Inspection reveals no atrophy of the bilateral APB or FDI or hand intrinsics. There is no swelling, color changes, allodynia or dystrophic changes. There is 5 out of  5 strength in the bilateral wrist extension, finger abduction and long finger flexion. There is intact sensation to light touch in all dermatomal and peripheral nerve distributions.  There is a negative Froment's test bilaterally. There is a negative Tinel's test at the bilateral wrist and elbow. There is a negative Phalen's test bilaterally. There is a negative Hoffmann's test bilaterally.  ?Skin: ?   General: Skin is warm and dry.  ?   Findings: No erythema or rash.  ?Neurological:  ?   General: No focal deficit present.  ?   Mental Status: She is alert and oriented to person, place, and time.  ?   Motor: No weakness or abnormal muscle tone.  ?   Coordination: Coordination normal.  ?Psychiatric:     ?   Mood and Affect: Mood normal.     ?   Behavior: Behavior normal.  ?  ? ?Imaging: ?No results found. ?

## 2021-11-17 ENCOUNTER — Encounter: Payer: Self-pay | Admitting: Orthopedic Surgery

## 2021-11-17 ENCOUNTER — Ambulatory Visit (INDEPENDENT_AMBULATORY_CARE_PROVIDER_SITE_OTHER): Payer: Medicaid Other | Admitting: Orthopedic Surgery

## 2021-11-17 DIAGNOSIS — G5603 Carpal tunnel syndrome, bilateral upper limbs: Secondary | ICD-10-CM | POA: Diagnosis not present

## 2021-11-17 DIAGNOSIS — G5602 Carpal tunnel syndrome, left upper limb: Secondary | ICD-10-CM | POA: Insufficient documentation

## 2021-11-17 NOTE — Progress Notes (Signed)
? ?Office Visit Note ?  ?Patient: Alyssa Blackwell           ?Date of Birth: 08/18/1985           ?MRN: [id number] ?Visit Date: 11/17/2021 ?             ?Requested by: Mora Bellman, MD ?[facility] ?[place],  [place] [address] ?PCP: Mora Bellman, MD ? ? ?Assessment & Plan: ?Visit Diagnoses:  ?1. Bilateral carpal tunnel syndrome   ? ? ?Plan: We discussed the diagnosis, prognosis, and both conservative and operative treatment options for her carpal tunnel syndrome.  We discussed continued night bracing, corticosteroid injection, or carpal tunnel release. ? ?After our discussion, the patient has elected to proceed with carpal tunnel release.  We reviewed the benefits of surgery and the potential risks including, but not limited to, persistent symptoms, infection, damage to nearby nerves and blood vessels, delayed wound healing, need for additional surgeries.  She would like to start with the right side first.   ? ?All patient concerns and questions were addressed. ? ?A surgical date will be confirmed with the patient.   ? ?Follow-Up Instructions: No follow-ups on file.  ? ?Orders:  ?No orders of the defined types were placed in this encounter. ? ?No orders of the defined types were placed in this encounter. ? ? ? ? Procedures: ?No procedures performed ? ? ?Clinical Data: ?No additional findings. ? ? ?Subjective: ?Chief Complaint  ?Patient presents with  ? Left Hand - Follow-up  ? Right Hand - Follow-up  ? ? ?This is a 35 year old right-hand-dominant female presents for follow-up of bilateral hand numbness and tingling.  She recent underwent EMG/nerve conduction study which was notable for mild bilateral carpal tunnel syndrome.  She still describes numbness and tingling involving the thumb, index, middle, and ring fingers.  She describes numbness and tingling in the office today.  Her symptoms are worse with certain activities such as driving and holding the steering wheel.  She notes  occasionally dropping things.  This wakes her up nearly every night.  Her right side is more affected than the left side.  She has worn night braces with minimal symptom relief. ? ? ?Review of Systems ? ? ?Objective: ?Vital Signs: There were no vitals taken for this visit. ? ?Physical Exam ?Constitutional:   ?   Appearance: Normal appearance.  ?Cardiovascular:  ?   Rate and Rhythm: Normal rate.  ?   Pulses: Normal pulses.  ?Pulmonary:  ?   Effort: Pulmonary effort is normal.  ?Skin: ?   General: Skin is warm and dry.  ?   Capillary Refill: Capillary refill takes less than 2 seconds.  ?Neurological:  ?   Mental Status: She is alert.  ? ? ?Right Hand Exam  ? ?Tenderness  ?The patient is experiencing no tenderness.  ? ?Range of Motion  ?The patient has normal right wrist ROM.  ? ?Other  ?Erythema: absent ?Sensation: normal ?Pulse: present ? ?Comments:  Positive Tinel and Phalen signs.  5/5 thenar motor strength without atrophy.  ? ? ?Left Hand Exam  ? ?Tenderness  ?The patient is experiencing no tenderness.  ? ?Range of Motion  ?The patient has normal left wrist ROM. ? ?Other  ?Erythema: absent ?Sensation: normal ?Pulse: present ? ?Comments:  Positive Tinel and Phalen signs.  5/5 thenar motor strength without atrophy.  ? ? ? ? ?Specialty Comments:  ?No specialty comments available. ? ?Imaging: ?No results found. ? ? ?[place]  History: ?Patient Active Problem List  ? Diagnosis Date Noted  ? Bilateral carpal tunnel syndrome 11/17/2021  ? Bilateral hand numbness 10/05/2021  ? ASCUS with positive high risk HPV cervical 03/13/2021  ? Tachycardia 12/29/2020  ? Vulvovaginitis 10/10/2020  ? Language barrier 10/10/2020  ? Dental abscess 10/10/2020  ? History of gestational diabetes in prior pregnancy, currently pregnant 08/12/2020  ? ?Past Medical History:  ?Diagnosis Date  ? Arrhythmia   ? occurs only in pregnancy, had evaluation in [place] with 5th pregnancy, normal evaluation per pt  ? Gestational diabetes   ? preg #5  ?  Gestational hypertension   ? Panic attacks   ?  ?Family History  ?Problem Relation Age of Onset  ? Heart attack Father   ? Heart disease Father   ? Hypertension Paternal Grandmother   ? Healthy Mother   ?  ?Past Surgical History:  ?Procedure Laterality Date  ? NO PAST SURGERIES    ? ?Social History  ? ?Occupational History  ? 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  ?Tobacco Use  ? Smoking status: Never  ? Smokeless tobacco: Never  ?Vaping Use  ? Vaping Use: Never used  ?Substance and Sexual Activity  ? Alcohol use: Never  ? Drug use: Never  ? Sexual activity: Yes  ?  Birth control/protection: None  ? ? ? ? ? ? ?

## 2021-11-29 ENCOUNTER — Ambulatory Visit: Payer: Medicaid Other | Attending: Physician Assistant | Admitting: Physician Assistant

## 2021-11-29 ENCOUNTER — Encounter: Payer: Self-pay | Admitting: Physician Assistant

## 2021-11-29 ENCOUNTER — Other Ambulatory Visit: Payer: Self-pay

## 2021-11-29 ENCOUNTER — Other Ambulatory Visit (HOSPITAL_COMMUNITY)
Admission: RE | Admit: 2021-11-29 | Discharge: 2021-11-29 | Disposition: A | Discharge status: Home / Self Care | Payer: Medicaid Other | Source: Ambulatory Visit | Attending: Physician Assistant | Admitting: Physician Assistant

## 2021-11-29 VITALS — BP 119/88 | HR 89 | Temp 98.1°F | Wt 180.0 lb

## 2021-11-29 DIAGNOSIS — B349 Viral infection, unspecified: Secondary | ICD-10-CM | POA: Diagnosis not present

## 2021-11-29 DIAGNOSIS — R112 Nausea with vomiting, unspecified: Secondary | ICD-10-CM

## 2021-11-29 DIAGNOSIS — R197 Diarrhea, unspecified: Secondary | ICD-10-CM | POA: Diagnosis not present

## 2021-11-29 DIAGNOSIS — N898 Other specified noninflammatory disorders of vagina: Secondary | ICD-10-CM | POA: Insufficient documentation

## 2021-11-29 LAB — POCT URINALYSIS DIP (CLINITEK)
Glucose, UA: NEGATIVE mg/dL
Leukocytes, UA: NEGATIVE
Nitrite, UA: NEGATIVE
POC PROTEIN,UA: >=300 — AB
Spec Grav, UA: >=1.03 — AB (ref 1.010–1.025)
Urobilinogen, UA: 1 E.U./dL
pH, UA: 6 (ref 5.0–8.0)

## 2021-11-29 LAB — POCT URINE PREGNANCY: Preg Test, Ur: NEGATIVE

## 2021-11-29 MED ORDER — PROMETHAZINE HCL 25 MG PO TABS
25.0000 mg | ORAL_TABLET | Freq: Three times a day (TID) | ORAL | 0 refills | Status: DC | PRN
  Filled 2021-11-29: qty 20, 7d supply, fill #0

## 2021-11-29 MED ORDER — LOPERAMIDE HCL 2 MG PO TABS
2.0000 mg | ORAL_TABLET | Freq: Four times a day (QID) | ORAL | 0 refills | Status: DC | PRN
  Filled 2021-11-29: qty 30, 8d supply, fill #0

## 2021-11-29 NOTE — Progress Notes (Signed)
Patient ID: Alyssa Blackwell, female   DOB: 08/17/1985, 36 y.o.   MRN: [id number] ? ? ? ? ?Alyssa Blackwell, is a 36 y.o. female ? ?[id number] ? ?[id number] ? ?DOB - 1985-08-18 ? ?No chief complaint on file. ?    ? ?Subjective:  ? ?Alyssa Blackwell is a 36 y.o. female here today for Vaginal odor for about 9 months.  No pelvic pain.  No dysuria.  No noted discharge ? ?3 day h/o nausea/vomiting/diarrhea, tender in mid epigastric area ?Temps ranging up to 38.  27 month old has same symptoms.  No severe abdominal pain.   ? ?Onset:  3 days ago ?Location: N/V/D ?Duration:  all day/3 days ?Characterization/quality: ?Alleviating/aggravating:  has not taken anything ?Radiation: no ?Severity:  moderate.   ? ?No problems updated. ? ?ALLERGIES: ?No Known Allergies ? ?PAST MEDICAL HISTORY: ?Past Medical History:  ?Diagnosis Date  ? Arrhythmia   ? occurs only in pregnancy, had evaluation in [place] with 5th pregnancy, normal evaluation per pt  ? Gestational diabetes   ? preg #5  ? Gestational hypertension   ? Panic attacks   ? ? ?MEDICATIONS AT HOME: ?Prior to Admission medications   ?Medication Sig Start Date End Date Taking? Authorizing Provider  ?loperamide (IMODIUM A-D) 2 MG tablet Take 1 tablet (2 mg total) by mouth 4 (four) times daily as needed for diarrhea or loose stools. 11/29/21  Yes Anders Simmonds, PA-C  ?promethazine (PHENERGAN) 25 MG tablet Take 1 tablet (25 mg total) by mouth every 8 (eight) hours as needed for nausea or vomiting. 11/29/21  Yes Anders Simmonds, PA-C  ?acetaminophen (TYLENOL) 325 MG tablet Take 2 tablets (650 mg total) by mouth every 4 (four) hours as needed (for pain scale < 4). 01/20/21   Littie Deeds, MD  ?coconut oil OIL Apply 1 application topically as needed. 01/20/21   Littie Deeds, MD  ?cyclobenzaprine (FLEXERIL) 5 MG tablet Take 1 tablet (5 mg total) by mouth 3 (three) times daily as needed for muscle spasms. 02/27/21   Marylene Land, CNM  ?hydroquinone 4  % cream daily in the afternoon. 12/06/20   [provider]  ?ibuprofen (ADVIL) 600 MG tablet Take 1 tablet (600 mg total) by mouth every 6 (six) hours. 01/20/21   Littie Deeds, MD  ?methocarbamol (ROBAXIN) 500 MG tablet Take 1 tablet (500 mg total) by mouth 2 (two) times daily as needed for muscle spasms. 04/18/21   Eber Hong, MD  ?naproxen (NAPROSYN) 500 MG tablet Take 1 tablet (500 mg total) by mouth 2 (two) times daily with a meal. 04/18/21   Eber Hong, MD  ?predniSONE (DELTASONE) 20 MG tablet Take 1 tablet (20 mg total) by mouth daily with breakfast. 08/14/21   Hoy Register, MD  ?Prenatal Vit-Fe Fumarate-FA (PRENATAL VITAMIN PO) Take by mouth.    [provider]  ?RETIN-A 0.1 % cream Apply 1 application topically at bedtime. 12/07/20   [provider]  ? ? ?ROS: ?Neg HEENT ?Neg resp ?Neg cardiac ?Neg MS ?Neg psych ?Neg neuro ? ?Objective:  ? ?Vitals:  ? 11/29/21 0959  ?BP: 119/88  ?Pulse: 89  ?Temp: 98.1 ?F (36.7 ?C)  ?SpO2: 99%  ?Weight: 180 lb (81.6 kg)  ? ?Exam ?General appearance : Awake, alert, not in any distress. Speech Clear. Not toxic looking ?HEENT: Atraumatic and Normocephalic ?Neck: Supple, no JVD. No cervical lymphadenopathy.  ?Chest: Good air entry bilaterally, CTAB.  No rales/rhonchi/wheezing ?CVS: S1 S2 regular, no murmurs.  ?Abdomen:  Bowel sounds present, Non tender and not distended with no gaurding, rigidity or rebound. ?Extremities: B/L Lower Ext shows no edema, both legs are warm to touch ?Neurology: Awake alert, and oriented X 3, CN II-XII intact, Non focal ?Skin: No Rash ? ?Data Review ?Lab Results  ?Component Value Date  ? HGBA1C 5.5 08/14/2021  ? HGBA1C 5.1 08/12/2020  ? ? ?Assessment & Plan  ? ?1. Vaginal odor ?- Cervicovaginal ancillary only ?- POCT URINALYSIS DIP (CLINITEK) ?- POCT urine pregnancy ? ?2. Nausea and vomiting, unspecified vomiting type ?Non-acute abdomen.  Getting dehydration.  Hydration imperative ?- POCT urine pregnancy ?- promethazine  (PHENERGAN) 25 MG tablet; Take 1 tablet (25 mg total) by mouth every 8 (eight) hours as needed for nausea or vomiting.  Dispense: 20 tablet; Refill: 0 ?- Comprehensive metabolic panel ?- CBC with Differential/Platelet ? ?3. Diarrhea, unspecified type ?- loperamide (IMODIUM A-D) 2 MG tablet; Take 1 tablet (2 mg total) by mouth 4 (four) times daily as needed for diarrhea or loose stools.  Dispense: 30 tablet; Refill: 0 ?- Comprehensive metabolic panel ?- CBC with Differential/Platelet ? ?4. Viral syndrome ?Given that 79 month old has the same, likely will run it's course.  To ED if anything worsens.  Patient verbalizes understanding ?- Comprehensive metabolic panel ?- CBC with Differential/Platelet ? ? ?AMN interpreters "Maxine Glenn" used and additional time performing visit was required. ? ? ?Return if symptoms worsen or fail to improve. ? ?The patient was given clear instructions to go to ER or return to medical center if symptoms don't improve, worsen or new problems develop. The patient verbalized understanding. The patient was told to call to get lab results if they haven't heard anything in the next week.  ? ? ? ? ?Georgian Co, PA-C ?[facility] ?[place], [place] ?[phone]   ?11/29/2021, 10:18 AM  ?

## 2021-11-29 NOTE — Patient Instructions (Signed)
N?useas y v?mitos en los adultos ?Nausea and Vomiting, Adult ?Las n?useas son Neomia Dear sensaci?n de Dentist en el est?mago y de que se est? por vomitar. Los v?mitos se producen cuando los alimentos que est?n en el est?mago salen con fuerza de la boca. Los v?mitos pueden hacerlo sentir d?bil. Si vomita, o si no puede beber una cantidad suficiente de l?quido, es posible que no tenga suficiente agua en el cuerpo (se deshidrate). Si no tiene suficiente agua en el cuerpo, es posible que usted: ?Se sienta cansado. ?Sienta sed. ?Tenga la boca seca. ?Tenga los [employer] ?Haga pis (orine) con menos frecuencia. ?Los [employer] y las personas que tienen otras enfermedades o que tienen debilitado el sistema de defensa del cuerpo (sistema inmunitario) corren un mayor riesgo de no tener una cantidad suficiente de agua del cuerpo. Si siente ganas de vomitar o vomita, es importante que siga las indicaciones del m?dico acerca de c?mo cuidarse. ?Siga estas indicaciones en su casa: ?Controle sus s?ntomas para detectar cualquier cambio. D?gale a su m?dico sobre ellos. ?Qu? debe comer y beber ? ?  ? ?Tome una SRO (soluci?n de rehidrataci?n oral). Es Neomia Dear bebida que se vende en farmacias y tiendas. ?Beba l?quidos transparentes en peque?as cantidades seg?n le sea posible, por ejemplo: ?[place]. ?Trocitos de hielo. ?Jugo de frutas con agua agregada (jugo de frutas diluido). ?Bebidas deportivas de bajas calor?as. ?En la medida en que pueda, consuma alimentos blandos y f?ciles de digerir en peque?as cantidades, por ejemplo, los siguientes: ?Bananas. ?Pur? de Geophysicist/field seismologist. ?Arroz. ?Carnes bajas en grasa Nonah Mattes). ?Pan tostado. ?Galletas. ?Evite beber l?quidos con alto contenido de az?car o cafe?na. Esto incluye bebidas energ?ticas, bebidas deportivas y gaseosas. ?Evite tomar alcohol. ?Evite los alimentos condimentados o con alto contenido de [place]. ?Indicaciones generales ?Use los medicamentos de venta libre y los recetados solamente como se  lo haya indicado el m?dico. ?Albesa Seen suficiente l?quido para mantener el pis (la orina) de color amarillo p?lido. ?L?vese las manos frecuentemente con agua y jab?n durante al menos 20 segundos. Use un desinfectante para manos si no dispone de agua y jab?n. ?Aseg?rese de que todas las personas que viven en su casa se laven bien las manos y con frecuencia. ?Haga reposo en su casa hasta que se sienta mejor. ?Controle su afecci?n para detectar cualquier cambio. ?Respire de forma lenta y profunda cuando sienta ganas de vomitar. ?Concurra a todas las visitas de seguimiento. ?Comun?quese con un m?dico si: ?Sus s?ntomas empeoran. ?Aparecen nuevos s?ntomas. ?Tiene fiebre. ?No puede beber l?quidos sin vomitar. ?La sensaci?n de que va a vomitar dura m?s de 2 d?as. ?Se siente aturdido o mareado. ?Tiene dolor de [place]. ?Tiene calambres musculares. ?Tiene una erupci?n cut?nea. ?Siente dolor al [employer]. ?Solicite ayuda de inmediato si: ?Siente dolor en el pecho, el cuello, los brazos o la mand?bula. ?Se siente muy d?bil o se desmaya. ?Vomita una y [place]. ?Vomita y el v?mito es de color rojo intenso o se asemeja al poso del caf?Marland Kitchen ?Tiene deposiciones (heces) con sangre o de color negro, o con aspecto alquitranado. ?Siente un dolor de cabeza intenso, rigidez en el cuello, o ambas cosas. ?Tiene dolor muy intenso en el vientre (abdomen), c?licos o meteorismo. ?Tiene dificultad para respirar. ?Respira muy r?pido. ?Su coraz?n late muy r?pidamente. ?Siente la piel fr?a y h?meda. ?Se siente confundido. ?Tiene signos de haber perdido [place] cantidad excesiva de agua del cuerpo, como: ?[place], muy escasa o falta de [place]. ?Labios agrietados. ?Sequedad de boca. ?Ojos hundidos. ?Somnolencia. ?Debilidad. ?Estos s?ntomas pueden  indicar Radio broadcast assistant. Solicite ayuda de inmediato. Llame al 911. ?No espere a ver si los s?ntomas desaparecen. ?No conduzca por sus propios medios [employer]. ?Resumen ?Las n?useas son Neomia Dear sensaci?n de  Dentist en el est?mago y de que se est? por vomitar. Los v?mitos se producen cuando los alimentos que se encuentran en el est?mago salen por la boca. ?Siga las instrucciones del m?dico respecto de las comidas y las bebidas. ?Use los medicamentos de venta libre y los recetados solamente como se lo haya indicado el m?dico. ?Comun?quese con su m?dico si sus s?ntomas empeoran o si tiene nuevos s?ntomas. ?Concurra a todas las visitas de seguimiento. ?Esta informaci?n no tiene Theme park manager el consejo del m?dico. Aseg?rese de hacerle al m?dico cualquier pregunta que tenga. ?Document Revised: 02/22/2021 Document Reviewed: 02/22/2021 ?Elsevier Patient Education ? 2023 Elsevier Inc. ? ?

## 2021-11-30 ENCOUNTER — Other Ambulatory Visit: Payer: Self-pay | Admitting: Physician Assistant

## 2021-11-30 DIAGNOSIS — R112 Nausea with vomiting, unspecified: Secondary | ICD-10-CM

## 2021-11-30 DIAGNOSIS — R7989 Other specified abnormal findings of blood chemistry: Secondary | ICD-10-CM

## 2021-11-30 LAB — CBC WITH DIFFERENTIAL/PLATELET
Basophils Absolute: 0 10*3/uL (ref 0.0–0.2)
Basos: 1 %
EOS (ABSOLUTE): 0.1 10*3/uL (ref 0.0–0.4)
Eos: 1 %
Hematocrit: 43.5 % (ref 34.0–46.6)
Hemoglobin: 15.2 g/dL (ref 11.1–15.9)
Immature Grans (Abs): 0 10*3/uL (ref 0.0–0.1)
Immature Granulocytes: 0 %
Lymphocytes Absolute: 1.6 10*3/uL (ref 0.7–3.1)
Lymphs: 34 %
MCH: 32.4 pg (ref 26.6–33.0)
MCHC: 34.9 g/dL (ref 31.5–35.7)
MCV: 93 fL (ref 79–97)
Monocytes Absolute: 0.7 10*3/uL (ref 0.1–0.9)
Monocytes: 15 %
Neutrophils Absolute: 2.3 10*3/uL (ref 1.4–7.0)
Neutrophils: 49 %
Platelets: 199 10*3/uL (ref 150–450)
RBC: 4.69 x10E6/uL (ref 3.77–5.28)
RDW: 12.7 % (ref 11.7–15.4)
WBC: 4.7 10*3/uL (ref 3.4–10.8)

## 2021-11-30 LAB — COMPREHENSIVE METABOLIC PANEL
ALT: 293 IU/L — ABNORMAL HIGH (ref 0–32)
AST: 202 IU/L — ABNORMAL HIGH (ref 0–40)
Albumin/Globulin Ratio: 1.8 (ref 1.2–2.2)
Albumin: 4.9 g/dL — ABNORMAL HIGH (ref 3.8–4.8)
Alkaline Phosphatase: 97 IU/L (ref 44–121)
BUN/Creatinine Ratio: 14 (ref 9–23)
BUN: 11 mg/dL (ref 6–20)
Bilirubin Total: 0.7 mg/dL (ref 0.0–1.2)
CO2: 23 mmol/L (ref 20–29)
Calcium: 9.4 mg/dL (ref 8.7–10.2)
Chloride: 99 mmol/L (ref 96–106)
Creatinine, Ser: 0.78 mg/dL (ref 0.57–1.00)
Globulin, Total: 2.7 g/dL (ref 1.5–4.5)
Glucose: 92 mg/dL (ref 70–99)
Potassium: 3.9 mmol/L (ref 3.5–5.2)
Sodium: 137 mmol/L (ref 134–144)
Total Protein: 7.6 g/dL (ref 6.0–8.5)
eGFR: 103 mL/min/{1.73_m2} (ref >59)

## 2021-11-30 LAB — CERVICOVAGINAL ANCILLARY ONLY
Bacterial Vaginitis (gardnerella): NEGATIVE
Candida Glabrata: NEGATIVE
Candida Vaginitis: NEGATIVE
Chlamydia: NEGATIVE
Comment: NEGATIVE
Comment: NEGATIVE
Comment: NEGATIVE
Comment: NEGATIVE
Comment: NEGATIVE
Comment: NORMAL
Neisseria Gonorrhea: NEGATIVE
Trichomonas: NEGATIVE

## 2021-12-04 DIAGNOSIS — Z419 Encounter for procedure for purposes other than remedying health state, unspecified: Secondary | ICD-10-CM | POA: Diagnosis not present

## 2021-12-05 DIAGNOSIS — Z309 Encounter for contraceptive management, unspecified: Secondary | ICD-10-CM | POA: Diagnosis not present

## 2021-12-05 DIAGNOSIS — Z3200 Encounter for pregnancy test, result unknown: Secondary | ICD-10-CM | POA: Diagnosis not present

## 2021-12-05 DIAGNOSIS — Z3042 Encounter for surveillance of injectable contraceptive: Secondary | ICD-10-CM | POA: Diagnosis not present

## 2021-12-08 ENCOUNTER — Ambulatory Visit: Payer: Medicaid Other | Attending: Family Medicine

## 2021-12-08 DIAGNOSIS — R112 Nausea with vomiting, unspecified: Secondary | ICD-10-CM | POA: Diagnosis not present

## 2021-12-08 DIAGNOSIS — R7989 Other specified abnormal findings of blood chemistry: Secondary | ICD-10-CM

## 2021-12-11 ENCOUNTER — Encounter: Payer: Self-pay | Admitting: Physician Assistant

## 2021-12-11 ENCOUNTER — Encounter: Payer: Self-pay | Admitting: Physical Medicine and Rehabilitation

## 2021-12-14 ENCOUNTER — Encounter: Payer: Self-pay | Admitting: Physician Assistant

## 2021-12-14 LAB — HEPATIC FUNCTION PANEL
ALT: 34 IU/L — ABNORMAL HIGH (ref 0–32)
AST: 17 IU/L (ref 0–40)
Albumin: 5.2 g/dL — ABNORMAL HIGH (ref 3.8–4.8)
Alkaline Phosphatase: 72 IU/L (ref 44–121)
Bilirubin Total: 0.5 mg/dL (ref 0.0–1.2)
Bilirubin, Direct: 0.15 mg/dL (ref 0.00–0.40)
Total Protein: 7.8 g/dL (ref 6.0–8.5)

## 2021-12-14 LAB — HAV, HBV, HCV
HCV Ab: NONREACTIVE
Hep B Core Total Ab: NEGATIVE
Hep B Surface Ab, Qual: NONREACTIVE
Hepatitis B Surface Ag: NEGATIVE
hep A Total Ab: POSITIVE — AB

## 2021-12-14 LAB — HEPATITIS A ANTIBODY, IGM: Hep A IgM: NEGATIVE

## 2021-12-14 LAB — GAMMA GT: GGT: 44 IU/L (ref 0–60)

## 2021-12-14 LAB — HCV INTERPRETATION

## 2021-12-21 ENCOUNTER — Ambulatory Visit: Payer: Medicaid Other | Admitting: Physician Assistant

## 2021-12-27 ENCOUNTER — Other Ambulatory Visit: Payer: Self-pay | Admitting: Physician Assistant

## 2021-12-27 DIAGNOSIS — H539 Unspecified visual disturbance: Secondary | ICD-10-CM

## 2022-01-02 ENCOUNTER — Telehealth: Payer: Self-pay

## 2022-01-02 NOTE — Telephone Encounter (Signed)
Patient called left message on triage phone stating she was returning a call? Was that from you?

## 2022-01-04 DIAGNOSIS — Z419 Encounter for procedure for purposes other than remedying health state, unspecified: Secondary | ICD-10-CM | POA: Diagnosis not present

## 2022-01-05 ENCOUNTER — Ambulatory Visit: Payer: Medicaid Other | Admitting: Physical Medicine and Rehabilitation

## 2022-01-05 ENCOUNTER — Encounter: Payer: Self-pay | Admitting: Physical Medicine and Rehabilitation

## 2022-01-14 NOTE — Anesthesia Preprocedure Evaluation (Signed)
Anesthesia Evaluation  Patient identified by MRN, date of birth, ID band Patient awake    Reviewed: Allergy & Precautions, NPO status , Patient's Chart, lab work & pertinent test results  History of Anesthesia Complications Negative for: history of anesthetic complications  Airway Mallampati: II  TM Distance: >3 FB Neck ROM: Full    Dental no notable dental hx.    Pulmonary neg pulmonary ROS,    Pulmonary exam normal        Cardiovascular negative cardio ROS Normal cardiovascular exam     Neuro/Psych Anxiety    GI/Hepatic negative GI ROS, Neg liver ROS,   Endo/Other  negative endocrine ROS  Renal/GU negative Renal ROS  negative genitourinary   Musculoskeletal Right carpal tunnel syndrome   Abdominal   Peds  Hematology negative hematology ROS (+)   Anesthesia Other Findings Day of surgery medications reviewed with patient.  Reproductive/Obstetrics negative OB ROS                            Anesthesia Physical Anesthesia Plan  ASA: 2  Anesthesia Plan: MAC   Post-op Pain Management: Tylenol PO (pre-op)*   Induction:   PONV Risk Score and Plan: 2 and Propofol infusion, Treatment may vary due to age or medical condition, Midazolam and Ondansetron  Airway Management Planned: Natural Airway and Simple Face Mask  Additional Equipment: None  Intra-op Plan:   Post-operative Plan:   Informed Consent: I have reviewed the patients History and Physical, chart, labs and discussed the procedure including the risks, benefits and alternatives for the proposed anesthesia with the patient or authorized representative who has indicated his/her understanding and acceptance.     Interpreter used for [employer] Discussed with: CRNA  Anesthesia Plan Comments:        Anesthesia Quick Evaluation

## 2022-01-15 ENCOUNTER — Ambulatory Visit (HOSPITAL_BASED_OUTPATIENT_CLINIC_OR_DEPARTMENT_OTHER): Payer: Medicaid Other | Admitting: Anesthesiology

## 2022-01-15 ENCOUNTER — Encounter (HOSPITAL_BASED_OUTPATIENT_CLINIC_OR_DEPARTMENT_OTHER)
Admission: RE | Disposition: A | Discharge status: Home / Self Care | Payer: Self-pay | Source: Home / Self Care | Attending: Orthopedic Surgery

## 2022-01-15 ENCOUNTER — Encounter (HOSPITAL_BASED_OUTPATIENT_CLINIC_OR_DEPARTMENT_OTHER): Payer: Self-pay | Admitting: Orthopedic Surgery

## 2022-01-15 ENCOUNTER — Other Ambulatory Visit: Payer: Self-pay

## 2022-01-15 ENCOUNTER — Ambulatory Visit (HOSPITAL_BASED_OUTPATIENT_CLINIC_OR_DEPARTMENT_OTHER)
Admission: RE | Admit: 2022-01-15 | Discharge: 2022-01-15 | Disposition: A | Discharge status: Home / Self Care | Payer: Medicaid Other | Attending: Orthopedic Surgery | Admitting: Orthopedic Surgery

## 2022-01-15 DIAGNOSIS — F419 Anxiety disorder, unspecified: Secondary | ICD-10-CM | POA: Insufficient documentation

## 2022-01-15 DIAGNOSIS — G5601 Carpal tunnel syndrome, right upper limb: Secondary | ICD-10-CM

## 2022-01-15 DIAGNOSIS — Z8632 Personal history of gestational diabetes: Secondary | ICD-10-CM | POA: Insufficient documentation

## 2022-01-15 HISTORY — PX: CARPAL TUNNEL RELEASE: SHX101

## 2022-01-15 LAB — POCT PREGNANCY, URINE: Preg Test, Ur: NEGATIVE

## 2022-01-15 SURGERY — CARPAL TUNNEL RELEASE
Anesthesia: Monitor Anesthesia Care | Site: Wrist | Laterality: Right

## 2022-01-15 MED ORDER — OXYCODONE HCL 5 MG PO TABS: 5.0000 mg | ORAL_TABLET | Freq: Once | ORAL | Status: DC | PRN

## 2022-01-15 MED ORDER — ACETAMINOPHEN 500 MG PO TABS
ORAL_TABLET | ORAL | Status: AC
  Filled 2022-01-15: qty 2

## 2022-01-15 MED ORDER — LIDOCAINE 2% (20 MG/ML) 5 ML SYRINGE
INTRAMUSCULAR | Status: AC
  Filled 2022-01-15: qty 5

## 2022-01-15 MED ORDER — MIDAZOLAM HCL 2 MG/2ML IJ SOLN
INTRAMUSCULAR | Status: AC
  Filled 2022-01-15: qty 2

## 2022-01-15 MED ORDER — FENTANYL CITRATE (PF) 100 MCG/2ML IJ SOLN
INTRAMUSCULAR | Status: AC
  Filled 2022-01-15: qty 2

## 2022-01-15 MED ORDER — DEXMEDETOMIDINE (PRECEDEX) IN NS 20 MCG/5ML (4 MCG/ML) IV SYRINGE
PREFILLED_SYRINGE | INTRAVENOUS | Status: AC
  Filled 2022-01-15: qty 5

## 2022-01-15 MED ORDER — ONDANSETRON HCL 4 MG/2ML IJ SOLN
INTRAMUSCULAR | Status: AC
  Filled 2022-01-15: qty 2

## 2022-01-15 MED ORDER — LACTATED RINGERS IV SOLN: INTRAVENOUS | Status: DC | PRN

## 2022-01-15 MED ORDER — LIDOCAINE HCL (CARDIAC) PF 100 MG/5ML IV SOSY
PREFILLED_SYRINGE | INTRAVENOUS | Status: DC | PRN
  Administered 2022-01-15: 50 mg via INTRAVENOUS

## 2022-01-15 MED ORDER — ONDANSETRON HCL 4 MG/2ML IJ SOLN
INTRAMUSCULAR | Status: DC | PRN
  Administered 2022-01-15: 4 mg via INTRAVENOUS

## 2022-01-15 MED ORDER — OXYCODONE HCL 5 MG/5ML PO SOLN: 5.0000 mg | Freq: Once | ORAL | Status: DC | PRN

## 2022-01-15 MED ORDER — FENTANYL CITRATE (PF) 100 MCG/2ML IJ SOLN
INTRAMUSCULAR | Status: DC | PRN
  Administered 2022-01-15: 50 ug via INTRAVENOUS

## 2022-01-15 MED ORDER — 0.9 % SODIUM CHLORIDE (POUR BTL) OPTIME
TOPICAL | Status: DC | PRN
  Administered 2022-01-15: 1000 mL

## 2022-01-15 MED ORDER — MIDAZOLAM HCL 5 MG/5ML IJ SOLN
INTRAMUSCULAR | Status: DC | PRN
  Administered 2022-01-15: 1 mg via INTRAVENOUS

## 2022-01-15 MED ORDER — KETOROLAC TROMETHAMINE 30 MG/ML IJ SOLN
INTRAMUSCULAR | Status: AC
  Filled 2022-01-15: qty 2

## 2022-01-15 MED ORDER — LIDOCAINE HCL 1 % IJ SOLN
INTRAMUSCULAR | Status: DC | PRN
  Administered 2022-01-15: 10 mL

## 2022-01-15 MED ORDER — PROPOFOL 500 MG/50ML IV EMUL
INTRAVENOUS | Status: DC | PRN
  Administered 2022-01-15: 75 ug/kg/min via INTRAVENOUS

## 2022-01-15 MED ORDER — KETOROLAC TROMETHAMINE 30 MG/ML IJ SOLN
INTRAMUSCULAR | Status: DC | PRN
  Administered 2022-01-15: 30 mg via INTRAVENOUS

## 2022-01-15 MED ORDER — FENTANYL CITRATE (PF) 100 MCG/2ML IJ SOLN: 25.0000 ug | INTRAMUSCULAR | Status: DC | PRN

## 2022-01-15 MED ORDER — ACETAMINOPHEN 500 MG PO TABS
1000.0000 mg | ORAL_TABLET | Freq: Once | ORAL | Status: AC
  Administered 2022-01-15: 1000 mg via ORAL

## 2022-01-15 SURGICAL SUPPLY — 44 items
APL PRP STRL LF DISP 70% ISPRP (MISCELLANEOUS) ×1
BLADE SURG 15 STRL LF DISP TIS (BLADE) ×1 IMPLANT
BLADE SURG 15 STRL SS (BLADE) ×2
BNDG CMPR 9X4 STRL LF SNTH (GAUZE/BANDAGES/DRESSINGS) ×1
BNDG ELASTIC 3X5.8 VLCR STR LF (GAUZE/BANDAGES/DRESSINGS) ×2 IMPLANT
BNDG ESMARK 4X9 LF (GAUZE/BANDAGES/DRESSINGS) ×2 IMPLANT
BNDG GAUZE DERMACEA FLUFF (GAUZE/BANDAGES/DRESSINGS) ×1
BNDG GAUZE DERMACEA FLUFF 4 (GAUZE/BANDAGES/DRESSINGS) ×1 IMPLANT
BNDG GZE DERMACEA 4 6PLY (GAUZE/BANDAGES/DRESSINGS) ×1
BNDG PLASTER X FAST 3X3 WHT LF (CAST SUPPLIES) IMPLANT
BNDG PLSTR 9X3 FST ST WHT (CAST SUPPLIES)
CHLORAPREP W/TINT 26 (MISCELLANEOUS) ×2 IMPLANT
CORD BIPOLAR FORCEPS 12FT (ELECTRODE) ×2 IMPLANT
COVER BACK TABLE 60X90IN (DRAPES) ×2 IMPLANT
COVER MAYO STAND STRL (DRAPES) ×2 IMPLANT
CUFF TOURN SGL QUICK 18X4 (TOURNIQUET CUFF) IMPLANT
CUFF TOURN SGL QUICK 24 (TOURNIQUET CUFF)
CUFF TRNQT CYL 24X4X16.5-23 (TOURNIQUET CUFF) IMPLANT
DRAPE EXTREMITY T 121X128X90 (DISPOSABLE) ×2 IMPLANT
DRAPE SURG 17X23 STRL (DRAPES) ×2 IMPLANT
GAUZE XEROFORM 1X8 LF (GAUZE/BANDAGES/DRESSINGS) IMPLANT
GLOVE BIO SURGEON STRL SZ7 (GLOVE) ×2 IMPLANT
GLOVE BIOGEL PI IND STRL 7.0 (GLOVE) ×1 IMPLANT
GLOVE BIOGEL PI INDICATOR 7.0 (GLOVE) ×1
GOWN STRL REUS W/ TWL LRG LVL3 (GOWN DISPOSABLE) ×1 IMPLANT
GOWN STRL REUS W/TWL LRG LVL3 (GOWN DISPOSABLE) ×2
GOWN STRL REUS W/TWL XL LVL3 (GOWN DISPOSABLE) ×2 IMPLANT
NDL HYPO 25X1 1.5 SAFETY (NEEDLE) IMPLANT
NEEDLE HYPO 25X1 1.5 SAFETY (NEEDLE) IMPLANT
NS IRRIG 1000ML POUR BTL (IV SOLUTION) ×2 IMPLANT
PACK BASIN DAY SURGERY FS (CUSTOM PROCEDURE TRAY) ×2 IMPLANT
PAD CAST 3X4 CTTN HI CHSV (CAST SUPPLIES) ×1 IMPLANT
PADDING CAST COTTON 3X4 STRL (CAST SUPPLIES) ×2
SLEEVE SCD COMPRESS KNEE MED (STOCKING) IMPLANT
SUCTION FRAZIER HANDLE 10FR (MISCELLANEOUS)
SUCTION TUBE FRAZIER 10FR DISP (MISCELLANEOUS) IMPLANT
SUT ETHILON 4 0 PS 2 18 (SUTURE) ×2 IMPLANT
SUT MNCRL AB 3-0 PS2 18 (SUTURE) IMPLANT
SUT VICRYL 4-0 PS2 18IN ABS (SUTURE) IMPLANT
SYR BULB EAR ULCER 3OZ GRN STR (SYRINGE) ×2 IMPLANT
SYR CONTROL 10ML LL (SYRINGE) IMPLANT
TOWEL GREEN STERILE FF (TOWEL DISPOSABLE) ×4 IMPLANT
TUBE CONNECTING 20X1/4 (TUBING) IMPLANT
UNDERPAD 30X36 HEAVY ABSORB (UNDERPADS AND DIAPERS) ×2 IMPLANT

## 2022-01-15 NOTE — Anesthesia Postprocedure Evaluation (Signed)
Anesthesia Post Note  Patient: Alyssa Blackwell  Procedure(s) Performed: RIGHT CARPAL TUNNEL RELEASE (Right: Wrist)     Patient location during evaluation: PACU Anesthesia Type: MAC Level of consciousness: awake and alert Pain management: pain level controlled Vital Signs Assessment: post-procedure vital signs reviewed and stable Respiratory status: spontaneous breathing, nonlabored ventilation and respiratory function stable Cardiovascular status: blood pressure returned to baseline Postop Assessment: no apparent nausea or vomiting Anesthetic complications: no   No notable events documented.  Last Vitals:  Vitals:   01/15/22 1300 01/15/22 1315  BP: 108/63 108/66  Pulse: (!) 56 (!) 54  Resp: 15 15  Temp:    SpO2: 96% 96%    Last Pain:  Vitals:   01/15/22 1315  PainSc: 0-No pain                 Marthenia Rolling

## 2022-01-15 NOTE — Discharge Instructions (Addendum)
 Charles Benfield, M.D. Hand Surgery  POST-OPERATIVE DISCHARGE INSTRUCTIONS   PRESCRIPTIONS: - You have been given a prescription to be taken as directed for post-operative pain control.  You may also take over the counter ibuprofen/aleve and tylenol for pain. Take this as directed on the packaging. Do not exceed 3000 mg tylenol/acetaminophen in 24 hours.  Ibuprofen 600-800 mg (3-4) tablets by mouth every 6 hours as needed for pain.   OR  Aleve 2 tablets by mouth every 12 hours (twice daily) as needed for pain.   AND/OR  Tylenol 1000 mg (2 tablets) every 8 hours as needed for pain.  - Please use your pain medication carefully, as refills are limited and you may not be provided with one.  As stated above, please use over the counter pain medicine - it will also be helpful with decreasing your swelling.    ANESTHESIA: -After your surgery, post-surgical discomfort or pain is likely. This discomfort can last several days to a few weeks. At certain times of the day your discomfort may be more intense.   Did you receive a nerve block?   - A nerve block can provide pain relief for one hour to two days after your surgery. As long as the nerve block is working, you will experience little or no sensation in the area the surgeon operated on.  - As the nerve block wears off, you will begin to experience pain or discomfort. It is very important that you begin taking your prescribed pain medication before the nerve block fully wears off. Treating your pain at the first sign of the block wearing off will ensure your pain is better controlled and more tolerable when full-sensation returns. Do not wait until the pain is intolerable, as the medicine will be less effective. It is better to treat pain in advance than to try and catch up.   General Anesthesia:  If you did not receive a nerve block during your surgery, you will need to start taking your pain medication shortly after your surgery and  should continue to do so as prescribed by your surgeon.     ICE AND ELEVATION: - You may use ice for the first 48-72 hours, but it is not critical.   - Motion of your fingers is very important to decrease the swelling.  - Elevation, as much as possible for the next 48 hours, is critical for decreasing swelling as well as for pain relief. Elevation means when you are seated or lying down, you hand should be at or above your heart. When walking, the hand needs to be at or above the level of your elbow.  - If the bandage gets too tight, it may need to be loosened. Please contact our office and we will instruct you in how to do this.    SURGICAL BANDAGES:  - Keep your dressing and/or splint clean and dry at all times.  You can remove your dressing 4 days from now and change with a dry dressing or Band-Aids as needed thereafter. - You may place a plastic bag over your bandage to shower, but be careful, do not get your bandages wet.  - After the bandages have been removed, it is OK to get the stitches wet in a shower or with hand washing. Do Not soak or submerge the wound yet. Please do not use lotions or creams on the stitches.      HAND THERAPY:  - You may not need any. If you do,   we will begin this at your follow up visit in the clinic.    ACTIVITY AND WORK: - You are encouraged to move any fingers which are not in the bandage.  - Light use of the fingers is allowed to assist the other hand with daily hygiene and eating, but strong gripping or lifting is often uncomfortable and should be avoided.  - You might miss a variable period of time from work and hopefully this issue has been discussed prior to surgery. You may not do any heavy work with your affected hand for about 2 weeks.    [facility] [address] [place],  [place]  [address] [phone]   Tylenol after 4:45pm today Ibuprofen after 8pm today    Post Anesthesia Home Care  Instructions  Activity: Get plenty of rest for the remainder of the day. A responsible individual must stay with you for 24 hours following the procedure.  For the next 24 hours, DO NOT: -Drive a car -Advertising copywriter -Drink alcoholic beverages -Take any medication unless instructed by your physician -Make any legal decisions or sign important papers.  Meals: Start with liquid foods such as gelatin or soup. Progress to regular foods as tolerated. Avoid greasy, spicy, heavy foods. If nausea and/or vomiting occur, drink only clear liquids until the nausea and/or vomiting subsides. Call your physician if vomiting continues.  Special Instructions/Symptoms: Your throat may feel dry or sore from the anesthesia or the breathing tube placed in your throat during surgery. If this causes discomfort, gargle with warm salt water. The discomfort should disappear within 24 hours.  If you had a scopolamine patch placed behind your ear for the management of post- operative nausea and/or vomiting:  1. The medication in the patch is effective for 72 hours, after which it should be removed.  Wrap patch in a tissue and discard in the trash. Wash hands thoroughly with soap and water. 2. You may remove the patch earlier than 72 hours if you experience unpleasant side effects which may include dry mouth, dizziness or visual disturbances. 3. Avoid touching the patch. Wash your hands with soap and water after contact with the patch.

## 2022-01-15 NOTE — H&P (Signed)
HAND SURGERY     HPI: Alyssa Blackwell is a 36 y.o. female who presents with numbness and paresthesias in the right thumb, index, middle, and ring fingers with associated weakness.  She has persistent nocturnal symptoms  She has failed conservative management.  She presents today for right carpal tunnel release.   Past Medical History:  Diagnosis Date   Arrhythmia    occurs only in pregnancy, had evaluation in [place] with 5th pregnancy, normal evaluation per pt   Gestational diabetes    preg #5   Gestational hypertension    Panic attacks    Past Surgical History:  Procedure Laterality Date   NO PAST SURGERIES     Social History   Socioeconomic History   Marital status: Married    Spouse name: Not on file   Number of children: Not on file   Years of education: Not on file   Highest education level: Not on file  Occupational History   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  Tobacco Use   Smoking status: Never   Smokeless tobacco: Never  Vaping Use   Vaping Use: Never used  Substance and Sexual Activity   Alcohol use: Never   Drug use: Never   Sexual activity: Yes    Birth control/protection: None  Other Topics Concern   Not on file  Social History Narrative   Not on file   Social Determinants of Health   Financial Resource Strain: Not on file  Food Insecurity: Food Insecurity Present (04/19/2021)   Hunger Vital Sign    Worried About Running Out of Food in the Last Year: Sometimes true    Ran Out of Food in the Last Year: Sometimes true  Transportation Needs: No Transportation Needs (04/19/2021)   PRAPARE - Hydrologist (Medical): No    Lack of Transportation (Non-Medical): No  Physical Activity: Not on file  Stress: Not on file  Social Connections: Not on file   Family History  Problem Relation Age of Onset   Heart attack Father    Heart disease Father    Hypertension Paternal Grandmother    Healthy Mother    - negative  except otherwise stated in the family history section No Known Allergies Prior to Admission medications   Medication Sig Start Date End Date Taking? Authorizing Provider  acetaminophen (TYLENOL) 325 MG tablet Take 2 tablets (650 mg total) by mouth every 4 (four) hours as needed (for pain scale < 4). 01/20/21   Zola Button, MD  coconut oil OIL Apply 1 application topically as needed. 01/20/21   Zola Button, MD  cyclobenzaprine (FLEXERIL) 5 MG tablet Take 1 tablet (5 mg total) by mouth 3 (three) times daily as needed for muscle spasms. 02/27/21   Starr Lake, CNM  hydroquinone 4 % cream daily in the afternoon. 12/06/20   [provider]  ibuprofen (ADVIL) 600 MG tablet Take 1 tablet (600 mg total) by mouth every 6 (six) hours. 01/20/21   Zola Button, MD  loperamide (IMODIUM A-D) 2 MG tablet Take 1 tablet (2 mg total) by mouth 4 (four) times daily as needed for diarrhea or loose stools. 11/29/21   Argentina Donovan, PA-C  methocarbamol (ROBAXIN) 500 MG tablet Take 1 tablet (500 mg total) by mouth 2 (two) times daily as needed for muscle spasms. 04/18/21   Noemi Chapel, MD  naproxen (NAPROSYN) 500 MG tablet Take 1 tablet (500 mg total) by mouth 2 (two) times daily with a meal.  04/18/21   Noemi Chapel, MD  predniSONE (DELTASONE) 20 MG tablet Take 1 tablet (20 mg total) by mouth daily with breakfast. 08/14/21   Charlott Rakes, MD  Prenatal Vit-Fe Fumarate-FA (PRENATAL VITAMIN PO) Take by mouth.    [provider]  promethazine (PHENERGAN) 25 MG tablet Take 1 tablet (25 mg total) by mouth every 8 (eight) hours as needed for nausea or vomiting. 11/29/21   Argentina Donovan, PA-C  RETIN-A 0.1 % cream Apply 1 application topically at bedtime. 12/07/20   [provider]   No results found. - pertinent xrays, CT, MRI studies were reviewed and independently interpreted  Positive ROS: All other systems have been reviewed and were otherwise negative with the exception of those  mentioned in the HPI and as above.  Physical Exam: General: No acute distress, resting comfortably Cardiovascular: BUE warm and well perfused, normal rate Respiratory: No cyanosis, no use of accessory musculature Skin: No lesions in the area of chief complaint Neurologic: Sensation intact distally Psychiatric: Patient is at baseline mood and affect  Right Upper Extremity  Positive Tinel and Phalen signs 5/5 thenar motor strength.     Assessment: 36 yo F w/ R CTS that has failed conservative management  Plan: OR today for R CTR We again discussed the risks of surgery including bleeding, infection, damage to neurovascular structures, incomplete symptom relief, need for additional surgery Will be discharged to home from [place], M.D. [facility] 11:44 AM

## 2022-01-15 NOTE — Interval H&P Note (Signed)
History and Physical Interval Note:  01/15/2022 11:48 AM  Alyssa Blackwell  has presented today for surgery, with the diagnosis of RIGHT CARPAL TUNNEL SYNDROME.  The various methods of treatment have been discussed with the patient and family. After consideration of risks, benefits and other options for treatment, the patient has consented to  Procedure(s): RIGHT CARPAL TUNNEL RELEASE (Right) as a surgical intervention.  The patient's history has been reviewed, patient examined, no change in status, stable for surgery.  I have reviewed the patient's chart and labs.  Questions were answered to the patient's satisfaction.     Saanya Zieske Carsyn Boster

## 2022-01-15 NOTE — Op Note (Signed)
   Date of Surgery: 01/15/2022  INDICATIONS: Patient is a 36 y.o.-year-old female with right carpal tunnel syndrome that was confirmed with electrodiagnostic testing and has failed conservative management.  Risks, benefits, and alternatives to surgery were again discussed with the patient in the preoperative area. The patient wishes to proceed with surgery.  Informed consent was signed after our discussion.   PREOPERATIVE DIAGNOSIS:  Right carpal tunnel syndrome  POSTOPERATIVE DIAGNOSIS: Same.  PROCEDURE:  Right carpal tunnel release   SURGEON: Audria Nine, M.D.  ASSIST:   ANESTHESIA:  Local, MAC  IV FLUIDS AND URINE: See anesthesia.  ESTIMATED BLOOD LOSS: <5 mL.  IMPLANTS: * No implants in log *   DRAINS: None  COMPLICATIONS: None  DESCRIPTION OF PROCEDURE: The patient was met in the preoperative holding area where the surgical site was marked and the consent form was verified.  The patient was then taken to the operating room and transferred to the operating table.  All bony prominences were well padded.  A tourniquet was applied to the right forearm.  Monitored sedation was induced.   A formal time-out was performed to confirm that this was the correct patient, surgery, side, and site. A local block was performed using 1% plain lidocaine.  The operative extremity was prepped and draped in the usual and sterile fashion.    Following a second timeout, the limb was exsanguinated and the tourniquet inflated to 250 mmHg.  A longitudinal incision was made in line with the radial border of the ring finger from distal to the wrist flexion crease to the intersection of Kaplan's cardinal line.  The skin and subcutaneous tissue was sharply divided.  The longitudinally running palmar fascia was incised.  The thenar musculature was bluntly swept off of the transverse carpal ligament.  The ligament was divided from proximal to distal until the fat surrounding the palmar arch was encountered.  Retractors were then placed in the proximal aspect of the wound to visualize the distal antebrachial fascia.  The fascia was sharply divided under direct visualization.   The wound was then thoroughly irrigated with sterile saline.  The tourniquet was deflated.  Hemostasis was achieved with direct pressure and bipolar electrocautery.  The wound was then closed with 4-0 nylon sutures in a horizontal mattress fashion. The wound was then dressed with xeroform, folded kerlix, and an ace wrap.  The patient was then reversed from anesthesia and transferred to the postoperative bed.  All counts were correct x 2 at the end of the procedure.  The patient was taken to the recovery unit in stable condition.     POSTOPERATIVE PLAN: She will be discharged to home with appropriate pain medication and discharge instructions.  I'll see her in the office in 10-14 days for her first postop visit.   Audria Nine, MD 12:41 PM

## 2022-01-16 ENCOUNTER — Encounter (HOSPITAL_BASED_OUTPATIENT_CLINIC_OR_DEPARTMENT_OTHER): Payer: Self-pay | Admitting: Orthopedic Surgery

## 2022-01-16 NOTE — Transfer of Care (Signed)
Immediate Anesthesia Transfer of Care Note  Patient: Alyssa Blackwell  Procedure(s) Performed: RIGHT CARPAL TUNNEL RELEASE (Right: Wrist)  Patient Location: PACU  Anesthesia Type:MAC  Level of Consciousness: awake, alert , oriented and patient cooperative  Airway & Oxygen Therapy: Patient Spontanous Breathing and Patient connected to face mask oxygen  Post-op Assessment: Report given to RN and Post -op Vital signs reviewed and stable  Post vital signs: Reviewed and stable  Last Vitals:  Vitals Value Taken Time  BP 120/88 01/15/22 1423  Temp 36.7 C 01/15/22 1423  Pulse 57 01/15/22 1423  Resp 20 01/15/22 1423  SpO2 97 % 01/15/22 1423    Last Pain:  Vitals:   01/15/22 1423  TempSrc: Oral  PainSc: 0-No pain         Complications: No notable events documented.

## 2022-01-25 ENCOUNTER — Encounter: Payer: Medicaid Other | Admitting: Orthopedic Surgery

## 2022-02-03 DIAGNOSIS — Z419 Encounter for procedure for purposes other than remedying health state, unspecified: Secondary | ICD-10-CM | POA: Diagnosis not present

## 2022-02-09 ENCOUNTER — Ambulatory Visit: Payer: Self-pay

## 2022-02-09 NOTE — Telephone Encounter (Signed)
Pediatrician office of the pts child is calling to report the pt is reporting cough, sore throat, and hard time sleeping, body aches, temperature running 103 for 2 days, head ache, and congestion  No available appts  Spanish interpretor needed    No answer on pt. Phone, unable to leave a message.

## 2022-02-09 NOTE — Telephone Encounter (Signed)
Summary: High temperature, body aches, hard time sleeping advice   Pediatrician office of the pts child is calling to report the pt is reporting cough, sore throat, and hard time sleeping, body aches, temperature running 103 for 2 days, head ache, and congestion  No available appts  Spanish interpretor needed       2nd call attempted  via interpreter line [place] ID # [phone], to contact patient regarding sx. No answer, unable to leave VM.

## 2022-02-09 NOTE — Telephone Encounter (Signed)
  Chief Complaint:  interpreter ID # [id number] used and reports patient requesting medication for multiple sx negative for covid  Symptoms: coughing and has vomited due to hard cough, yellow mucus with some blood streaks in it. Hard to expel. sore throat, pain when swallowing, tonsils swollen, headache, pressure in forehead when bending down. Did have fever 103 2-3 days ago. Child also sick with same sx and negative for covid.  Frequency: sx started 5 days ago  Pertinent Negatives: Patient denies chest pain no difficulty breathing, no fever now  Disposition: [] ED /[] Urgent Care (no appt availability in office) / [] Appointment(In office/virtual)/ [x]  [facility]/ [] Home Care/ [] Refused Recommended Disposition /[] [facility]/ []  Follow-up with PCP Additional Notes:   Patient reports sending My Chart message and will look for a time for [facility]. Gave patient location and address for Mobile Bus for 02/10/22 if she would like to be seen in person. Please advise.    Reason for Disposition  SEVERE coughing spells (e.g., whooping sound after coughing, vomiting after coughing)  Answer Assessment - Initial Assessment Questions 1. ONSET: "When did the cough begin?"      5 days ago  2. SEVERITY: "How bad is the cough today?"      Worsening  3. SPUTUM: "Describe the color of your sputum" (none, dry cough; clear, white, yellow, green)     Yellow , hard to expel 4. HEMOPTYSIS: "Are you coughing up any blood?" If so ask: "How much?" (flecks, streaks, tablespoons, etc.)     Streaks of blood  5. DIFFICULTY BREATHING: "Are you having difficulty breathing?" If Yes, ask: "How bad is it?" (e.g., mild, moderate, severe)    - MILD: No SOB at rest, mild SOB with walking, speaks normally in sentences, can lie down, no retractions, pulse < 100.    - MODERATE: SOB at rest, SOB with minimal exertion and prefers to sit, cannot lie down flat, speaks in phrases, mild retractions, audible wheezing, pulse  100-120.    - SEVERE: Very SOB at rest, speaks in single words, struggling to breathe, sitting hunched forward, retractions, pulse > 120      No  6. FEVER: "Do you have a fever?" If Yes, ask: "What is your temperature, how was it measured, and when did it start?"    Not now has had fever for 103 3 days ago  7. CARDIAC HISTORY: "Do you have any history of heart disease?" (e.g., heart attack, congestive heart failure)      na 8. LUNG HISTORY: "Do you have any history of lung disease?"  (e.g., pulmonary embolus, asthma, emphysema)     na 9. PE RISK FACTORS: "Do you have a history of blood clots?" (or: recent major surgery, recent prolonged travel, bedridden)     Na  10. OTHER SYMPTOMS: "Do you have any other symptoms?" (e.g., runny nose, wheezing, chest pain)       Cough, sore throat  11. PREGNANCY: "Is there any chance you are pregnant?" "When was your last menstrual period?"       no 12. TRAVEL: "Have you traveled out of the country in the last month?" (e.g., travel history, exposures)       na  Protocols used: Cough - Acute Productive-A-AH

## 2022-02-12 ENCOUNTER — Ambulatory Visit: Payer: Medicaid Other | Attending: Family Medicine | Admitting: Family Medicine

## 2022-02-12 ENCOUNTER — Encounter: Payer: Self-pay | Admitting: Family Medicine

## 2022-02-12 DIAGNOSIS — J018 Other acute sinusitis: Secondary | ICD-10-CM | POA: Diagnosis not present

## 2022-02-12 MED ORDER — PREDNISONE 20 MG PO TABS: 20.0000 mg | ORAL_TABLET | Freq: Every day | ORAL | 0 refills | Status: DC

## 2022-02-12 MED ORDER — BENZONATATE 100 MG PO CAPS: 100.0000 mg | ORAL_CAPSULE | Freq: Two times a day (BID) | ORAL | 0 refills | Status: DC | PRN

## 2022-02-12 MED ORDER — CETIRIZINE HCL 10 MG PO TABS: 10.0000 mg | ORAL_TABLET | Freq: Every day | ORAL | 0 refills | Status: DC

## 2022-02-12 NOTE — Progress Notes (Signed)
Subjective:  Patient ID: Alyssa Blackwell, female    DOB: 1986/07/12  Age: 36 y.o. MRN: [id number]  CC: Cough   HPI Chantale Leugers Shatana Saxton is a 36 y.o. year old female who presents for an acute visit.  Interval History: C/o 1 week h/o cough and sore throat. Cough is dry and prevents her from sleeping. She has had ear pain and pressure in her frontal sinus. She has chest wall pain when she coughs. She had a subjective fever with associated myalgias and arthralgias. Denies presence of malaise or lethargy. Used Motrin and tylenol with no much relief.  Past Medical History:  Diagnosis Date   Arrhythmia    occurs only in pregnancy, had evaluation in [place] with 5th pregnancy, normal evaluation per pt   Gestational diabetes    preg #5   Gestational hypertension    Panic attacks     Past Surgical History:  Procedure Laterality Date   CARPAL TUNNEL RELEASE Right 01/15/2022   Procedure: RIGHT CARPAL TUNNEL RELEASE;  Surgeon: Marlyne Beards, MD;  Location: [facility];  Service: Orthopedics;  Laterality: Right;   NO PAST SURGERIES      Family History  Problem Relation Age of Onset   Heart attack Father    Heart disease Father    Hypertension Paternal Grandmother    Healthy Mother     Social History   Socioeconomic History   Marital status: Married    Spouse name: Not on file   Number of children: Not on file   Years of education: Not on file   Highest education level: Not on file  Occupational History   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  Tobacco Use   Smoking status: Never   Smokeless tobacco: Never  Vaping Use   Vaping Use: Never used  Substance and Sexual Activity   Alcohol use: Never   Drug use: Never   Sexual activity: Yes    Birth control/protection: None  Other Topics Concern   Not on file  Social History Narrative   Not on file   Social Determinants of Health   Financial Resource Strain: Not on file  Food Insecurity: Food  Insecurity Present (04/19/2021)   Hunger Vital Sign    Worried About Running Out of Food in the Last Year: Sometimes true    Ran Out of Food in the Last Year: Sometimes true  Transportation Needs: No Transportation Needs (04/19/2021)   PRAPARE - Administrator, Civil Service (Medical): No    Lack of Transportation (Non-Medical): No  Physical Activity: Not on file  Stress: Not on file  Social Connections: Not on file    No Known Allergies  Outpatient Medications Prior to Visit  Medication Sig Dispense Refill   acetaminophen (TYLENOL) 325 MG tablet Take 2 tablets (650 mg total) by mouth every 4 (four) hours as needed (for pain scale < 4).     coconut oil OIL Apply 1 application topically as needed.  0   cyclobenzaprine (FLEXERIL) 5 MG tablet Take 1 tablet (5 mg total) by mouth 3 (three) times daily as needed for muscle spasms. 20 tablet 0   hydroquinone 4 % cream daily in the afternoon.     ibuprofen (ADVIL) 600 MG tablet Take 1 tablet (600 mg total) by mouth every 6 (six) hours. 30 tablet 0   loperamide (IMODIUM A-D) 2 MG tablet Take 1 tablet (2 mg total) by mouth 4 (four) times daily as needed for diarrhea or loose stools.  30 tablet 0   methocarbamol (ROBAXIN) 500 MG tablet Take 1 tablet (500 mg total) by mouth 2 (two) times daily as needed for muscle spasms. 20 tablet 0   naproxen (NAPROSYN) 500 MG tablet Take 1 tablet (500 mg total) by mouth 2 (two) times daily with a meal. 30 tablet 0   Prenatal Vit-Fe Fumarate-FA (PRENATAL VITAMIN PO) Take by mouth.     promethazine (PHENERGAN) 25 MG tablet Take 1 tablet (25 mg total) by mouth every 8 (eight) hours as needed for nausea or vomiting. 20 tablet 0   RETIN-A 0.1 % cream Apply 1 application topically at bedtime.     predniSONE (DELTASONE) 20 MG tablet Take 1 tablet (20 mg total) by mouth daily with breakfast. (Patient not taking: Reported on 02/12/2022) 5 tablet 0   No facility-administered medications prior to visit.      ROS Review of Systems  Constitutional:  Negative for activity change, appetite change and fatigue.  HENT:  Positive for sore throat. Negative for congestion and sinus pressure.   Eyes:  Negative for visual disturbance.  Respiratory:  Positive for cough. Negative for chest tightness, shortness of breath and wheezing.   Cardiovascular:  Negative for chest pain and palpitations.  Gastrointestinal:  Negative for abdominal distention, abdominal pain and constipation.  Endocrine: Negative for polydipsia.  Genitourinary:  Negative for dysuria and frequency.  Musculoskeletal:  Positive for arthralgias and myalgias. Negative for back pain.  Skin:  Negative for rash.  Neurological:  Negative for tremors, light-headedness and numbness.  Hematological:  Does not bruise/bleed easily.  Psychiatric/Behavioral:  Negative for agitation and behavioral problems.     Objective:  BP 106/73   Pulse 90   Temp 99 F (37.2 C) (Oral)   Ht 5\' 1"  (1.549 m)   Wt 182 lb 3.2 oz (82.6 kg)   SpO2 99%   BMI 34.43 kg/m      02/12/2022    8:55 AM 01/15/2022    2:23 PM 01/15/2022    1:30 PM  BP/Weight  Systolic BP 106 120 113  Diastolic BP 73 88 56  Wt. (Lbs) 182.2    BMI 34.43 kg/m2        Physical Exam Constitutional:      Appearance: She is well-developed.  HENT:     Head:     Comments: Frontal sinus tenderness to percussion    Right Ear: Tympanic membrane normal.     Left Ear: Tympanic membrane normal.     Mouth/Throat:     Mouth: Mucous membranes are moist.  Cardiovascular:     Rate and Rhythm: Normal rate.     Heart sounds: Normal heart sounds. No murmur heard. Pulmonary:     Effort: Pulmonary effort is normal.     Breath sounds: Normal breath sounds. No wheezing or rales.  Chest:     Chest wall: No tenderness.  Abdominal:     General: Bowel sounds are normal. There is no distension.     Palpations: Abdomen is soft. There is no mass.     Tenderness: There is no abdominal  tenderness.  Musculoskeletal:        General: Normal range of motion.     Right lower leg: No edema.     Left lower leg: No edema.  Neurological:     Mental Status: She is alert and oriented to person, place, and time.  Psychiatric:        Mood and Affect: Mood normal.  Latest Ref Rng & Units 12/08/2021    4:40 PM 11/29/2021   10:28 AM 08/14/2021    9:44 AM  CMP  Glucose 70 - 99 mg/dL  92  93   BUN 6 - 20 mg/dL  11  10   Creatinine 2.77 - 1.00 mg/dL  8.24  2.35   Sodium 361 - 144 mmol/L  137  139   Potassium 3.5 - 5.2 mmol/L  3.9  4.6   Chloride 96 - 106 mmol/L  99  104   CO2 20 - 29 mmol/L  23  22   Calcium 8.7 - 10.2 mg/dL  9.4  9.4   Total Protein 6.0 - 8.5 g/dL 7.8  7.6    Total Bilirubin 0.0 - 1.2 mg/dL 0.5  0.7    Alkaline Phos 44 - 121 IU/L 72  97    AST 0 - 40 IU/L 17  202    ALT 0 - 32 IU/L 34  293      Lipid Panel     Component Value Date/Time   CHOL 152 08/14/2021 0944   TRIG 87 08/14/2021 0944   HDL 43 08/14/2021 0944   LDLCALC 92 08/14/2021 0944    CBC    Component Value Date/Time   WBC 4.7 11/29/2021 1028   WBC 12.4 (H) 01/19/2021 0128   RBC 4.69 11/29/2021 1028   RBC 4.09 01/19/2021 0128   HGB 15.2 11/29/2021 1028   HCT 43.5 11/29/2021 1028   PLT 199 11/29/2021 1028   MCV 93 11/29/2021 1028   MCH 32.4 11/29/2021 1028   MCH 31.8 01/19/2021 0128   MCHC 34.9 11/29/2021 1028   MCHC 33.4 01/19/2021 0128   RDW 12.7 11/29/2021 1028   LYMPHSABS 1.6 11/29/2021 1028   EOSABS 0.1 11/29/2021 1028   BASOSABS 0.0 11/29/2021 1028    Lab Results  Component Value Date   HGBA1C 5.5 08/14/2021    Assessment & Plan:  1. Other acute sinusitis, recurrence not specified - predniSONE (DELTASONE) 20 MG tablet; Take 1 tablet (20 mg total) by mouth daily with breakfast.  Dispense: 5 tablet; Refill: 0 - benzonatate (TESSALON) 100 MG capsule; Take 1 capsule (100 mg total) by mouth 2 (two) times daily as needed for cough.  Dispense: 20 capsule; Refill: 0 -  cetirizine (ZYRTEC) 10 MG tablet; Take 1 tablet (10 mg total) by mouth daily.  Dispense: 30 tablet; Refill: 0 - Novel Coronavirus, NAA ([facility])    Meds ordered this encounter  Medications   predniSONE (DELTASONE) 20 MG tablet    Sig: Take 1 tablet (20 mg total) by mouth daily with breakfast.    Dispense:  5 tablet    Refill:  0   benzonatate (TESSALON) 100 MG capsule    Sig: Take 1 capsule (100 mg total) by mouth 2 (two) times daily as needed for cough.    Dispense:  20 capsule    Refill:  0   cetirizine (ZYRTEC) 10 MG tablet    Sig: Take 1 tablet (10 mg total) by mouth daily.    Dispense:  30 tablet    Refill:  0    Follow-up: Return if symptoms worsen or fail to improve.       Hoy Register, MD, FAAFP. [facility] [place], [place] [phone]   02/12/2022, 9:41 AM

## 2022-02-12 NOTE — Patient Instructions (Signed)
Infeccin de los senos paranasales en los adultos Sinus Infection, Adult La infeccin de los senos paranasales es el dolor y la hinchazn (inflamacin) de los senos paranasales. Los senos paranasales son espacios vacos en los huesos alrededor del rostro. Estos se encuentran en los siguientes lugares: Alrededor de los ojos. En la mitad de la frente. Detrs de la nariz. En los pmulos. Los senos paranasales y las fosas nasales estn cubiertos de un lquido llamado mucosidad. La mucosidad drena a travs de los senos paranasales. La hinchazn puede atrapar mucosidad en los senos paranasales. Esto permite que se desarrollen grmenes (bacterias, virus u hongos), lo que produce infecciones. La mayora de las veces, la causa de esta afeccin es un virus. Cules son las causas? Alergias. Asma. Grmenes. Objetos que obstruyen la nariz o los senos paranasales. Crecimientos en el interior de la nariz (plipos nasales). Sustancias qumicas o irritantes que estn presentes en el aire. Un hongo. Esto es poco frecuente. Qu incrementa el riesgo? Tener debilitado el sistema de defensa del cuerpo (sistema inmunitario). Nadar o bucear mucho. Usar aerosoles nasales con mucha frecuencia. Fumar. Cules son los signos o sntomas? Los principales sntomas de esta afeccin son dolor y sensacin de presin alrededor de los senos paranasales. Otros sntomas incluyen: Nariz tapada (congestin nasal). Esto puede dificultar la respiracin por la nariz. Goteo nasal (drenaje). Dolor, hinchazn y calor en los senos paranasales. Tos que puede empeorar por la noche. Incapacidad de sentir olores y sabores. Mucosidad que se acumula en la garganta o la parte posterior de la nariz (goteo posnasal). Esto puede causar dolor de garganta o mal aliento. Estar muy cansado (fatigado). Fiebre. Cmo se diagnostica? Los sntomas. Sus antecedentes mdicos. Un examen fsico. Pruebas para averiguar si la afeccin es de corta  duracin (aguda) o de larga duracin (crnica). El mdico puede: Revisarle la nariz para detectar crecimientos (plipos). Revisarle los senos paranasales con una herramienta que tiene una luz en un extremo (endoscopio). Revisar si tiene alergias o grmenes. Hacerle pruebas de diagnstico por imgenes, como una resonancia magntica (RM) o exploracin por tomografa computarizada (TC). Cmo se trata? El tratamiento de esta afeccin depende de la causa y si es de corta o de larga duracin. Si la causa es un virus, los sntomas deberan desaparecer solos en el trmino de 10 das. Pueden darle medicamentos para aliviar los sntomas. Incluyen lo siguiente: Medicamentos para encoger el tejido inflamado de la nariz. Un aerosol para tratar la hinchazn de las fosas nasales. Enjuagues que ayudan a eliminar la mucosidad espesa de la nariz (lavados con solucin salina nasal). Medicamentos para tratar alergias (antihistamnicos). Analgsicos de venta libre. Si la causa es una bacteria, es posible que el mdico espere para averiguar si usted mejora sin tratamiento. Es posible que le den un antibitico si usted tiene: Una infeccin grave. El sistema de defensa del organismo debilitado. Si la causa son crecimientos en la nariz, es posible que necesite una ciruga. Siga estas instrucciones en su casa: Medicamentos [place], use o aplique los medicamentos de venta libre y los recetados solamente como se lo haya indicado el mdico. Estos pueden incluir aerosoles nasales. Si le recetaron un antibitico, tmelo como se lo haya indicado el mdico. No deje de tomarlo aunque comience a sentirse mejor. Hidrtese y humidifique los ambientes  Beba suficiente agua para mantener el pis (la orina) de color amarillo plido. Use un humidificador de vapor fro para mantener la humedad de su hogar por encima del 50 %. Inhale vapor durante 10 a 15 minutos,   de 3 a 4 veces al da, o como se lo haya indicado el mdico. Puede hacer  esto en el bao con el vapor del agua caliente de la ducha. Trate de no exponerse al aire fro o seco. Reposo Descanse todo lo posible. Duerma con la cabeza levantada (elevada). Asegrese de dormir lo suficiente cada noche. Instrucciones generales  Pngase un pao caliente y hmedo en el rostro 3 a 4 veces al da, o con la frecuencia indicada por el mdico. Hgase lavados con solucin salina nasal con la frecuencia que le haya indicado el mdico. Lvese las manos frecuentemente con agua y jabn. Use un desinfectante para manos si no dispone de agua y jabn. No fume. Evite estar cerca de personas que fuman (fumador pasivo). Concurra a todas las visitas de seguimiento. Comunquese con un mdico si: Tiene fiebre. Sus sntomas empeoran. Los sntomas no mejoran en el perodo de 10 das. Solicite ayuda de inmediato si: Tiene un dolor de cabeza muy intenso. No puede dejar de vomitar. Tiene dolor muy intenso o hinchazn en la zona del rostro o los ojos. Tiene dificultad para ver. Se siente confundido. Tiene el cuello rgido. Tiene dificultad para respirar. Estos sntomas pueden indicar una emergencia. Solicite ayuda de inmediato. Llame al 911. No espere a ver si los sntomas desaparecen. No conduzca por sus propios medios hasta el hospital. Resumen La infeccin de los senos paranasales es la hinchazn de los senos paranasales. Los senos paranasales son espacios vacos en los huesos alrededor del rostro. La causa de esta afeccin es la inflamacin o hinchazn de los tejidos que estn en el interior de la nariz. Esto hace que los grmenes queden atrapados. Los grmenes pueden provocar infecciones. Si le recetaron un antibitico, tmelo como se lo haya indicado el mdico. No deje de tomarlo aunque comience a sentirse mejor. Concurra a todas las visitas de seguimiento. Esta informacin no tiene como fin reemplazar el consejo del mdico. Asegrese de hacerle al mdico cualquier pregunta que  tenga. Document Revised: 07/12/2021 Document Reviewed: 07/12/2021 Elsevier Patient Education  2023 [employer].  

## 2022-02-12 NOTE — Progress Notes (Signed)
Sore throat, cough for 1 week

## 2022-02-14 LAB — NOVEL CORONAVIRUS, NAA: SARS-CoV-2, NAA: NOT DETECTED

## 2022-02-15 ENCOUNTER — Ambulatory Visit (INDEPENDENT_AMBULATORY_CARE_PROVIDER_SITE_OTHER): Payer: Medicaid Other | Admitting: Orthopedic Surgery

## 2022-02-15 DIAGNOSIS — G5601 Carpal tunnel syndrome, right upper limb: Secondary | ICD-10-CM

## 2022-02-15 DIAGNOSIS — G5603 Carpal tunnel syndrome, bilateral upper limbs: Secondary | ICD-10-CM

## 2022-02-15 NOTE — Progress Notes (Signed)
   Post-Op Visit Note   Patient: Alyssa Blackwell           Date of Birth: 1986-02-24           MRN: [id number] Visit Date: 02/15/2022 PCP: Hoy Register, MD   Assessment & Plan:  Chief Complaint:  Chief Complaint  Patient presents with   Right Hand - Routine Post Op   Left Hand - Follow-up   Visit Diagnoses:  1. Carpal tunnel syndrome, right upper limb   2. Bilateral carpal tunnel syndrome     Plan: Patient is 4 weeks s/p R CTR. She removed the sutures herself as she was unable to make it to her follow up appointment.  Her numbness and paresthesias on the right side have resolved as has her nocturnal symptoms.  She has mild pillar pain on this side.  Her left side is still quite bothersome and wake her from sleep almost every night.  Her left hand goes numb with driving and other activities. She would like to proceed with left carpal tunnel release.  We again reviewed the risks of surgery including bleeding, infection, damage to the median nerve or its branches, incomplete symptom relief, and need for additional surgery.  A surgical date and time will be confirmed with the patient.   Follow-Up Instructions: No follow-ups on file.   Orders:  No orders of the defined types were placed in this encounter.  No orders of the defined types were placed in this encounter.   Imaging: No results found.  PMFS History: Patient Active Problem List   Diagnosis Date Noted   Carpal tunnel syndrome, right upper limb    Bilateral carpal tunnel syndrome 11/17/2021   Bilateral hand numbness 10/05/2021   ASCUS with positive high risk HPV cervical 03/13/2021   Tachycardia 12/29/2020   Vulvovaginitis 10/10/2020   Language barrier 10/10/2020   Dental abscess 10/10/2020   History of gestational diabetes in prior pregnancy, currently pregnant 08/12/2020   Past Medical History:  Diagnosis Date   Arrhythmia    occurs only in pregnancy, had evaluation in [place] with 5th pregnancy,  normal evaluation per pt   Gestational diabetes    preg #5   Gestational hypertension    Panic attacks     Family History  Problem Relation Age of Onset   Heart attack Father    Heart disease Father    Hypertension Paternal Grandmother    Healthy Mother     Past Surgical History:  Procedure Laterality Date   CARPAL TUNNEL RELEASE Right 01/15/2022   Procedure: RIGHT CARPAL TUNNEL RELEASE;  Surgeon: Marlyne Beards, MD;  Location: [facility];  Service: Orthopedics;  Laterality: Right;   NO PAST SURGERIES     Social History   Occupational History   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  Tobacco Use   Smoking status: Never   Smokeless tobacco: Never  Vaping Use   Vaping Use: Never used  Substance and Sexual Activity   Alcohol use: Never   Drug use: Never   Sexual activity: Yes    Birth control/protection: None    Physical exam: Gen: Aox3, NAD Pulm: Normal WOB on RA, symmetric chest rise CV: Normal rate, BUE warm and well perfused R hand: incision well healed L hand: Positive Tinel, Phalen, and Durkan signs, 5/5 thenar motor strength.

## 2022-02-16 ENCOUNTER — Encounter (HOSPITAL_BASED_OUTPATIENT_CLINIC_OR_DEPARTMENT_OTHER): Payer: Self-pay | Admitting: Orthopedic Surgery

## 2022-02-16 ENCOUNTER — Other Ambulatory Visit: Payer: Self-pay

## 2022-02-16 NOTE — Progress Notes (Signed)
Pt seen at PCP on 7/10 with complaint of cough. Covid test negative and the patient was diagnosed with sinusitis. Reviewed note with Dr Sampson Goon -ok to proceed with surgery on 7/19.

## 2022-02-21 ENCOUNTER — Ambulatory Visit (HOSPITAL_BASED_OUTPATIENT_CLINIC_OR_DEPARTMENT_OTHER): Admission: RE | Admit: 2022-02-21 | Payer: Medicaid Other | Source: Ambulatory Visit | Admitting: Orthopedic Surgery

## 2022-02-21 DIAGNOSIS — Z01818 Encounter for other preprocedural examination: Secondary | ICD-10-CM

## 2022-02-21 SURGERY — CARPAL TUNNEL RELEASE
Anesthesia: Choice | Laterality: Left

## 2022-03-05 ENCOUNTER — Encounter: Payer: Medicaid Other | Admitting: Orthopedic Surgery

## 2022-03-06 DIAGNOSIS — Z419 Encounter for procedure for purposes other than remedying health state, unspecified: Secondary | ICD-10-CM | POA: Diagnosis not present

## 2022-03-13 ENCOUNTER — Other Ambulatory Visit: Payer: Self-pay

## 2022-03-13 ENCOUNTER — Encounter (HOSPITAL_BASED_OUTPATIENT_CLINIC_OR_DEPARTMENT_OTHER): Payer: Self-pay | Admitting: Orthopedic Surgery

## 2022-03-19 ENCOUNTER — Encounter (HOSPITAL_BASED_OUTPATIENT_CLINIC_OR_DEPARTMENT_OTHER)
Admission: RE | Disposition: A | Discharge status: Home / Self Care | Payer: Self-pay | Source: Home / Self Care | Attending: Orthopedic Surgery

## 2022-03-19 ENCOUNTER — Other Ambulatory Visit: Payer: Self-pay

## 2022-03-19 ENCOUNTER — Ambulatory Visit (HOSPITAL_BASED_OUTPATIENT_CLINIC_OR_DEPARTMENT_OTHER): Payer: Medicaid Other | Admitting: Certified Registered"

## 2022-03-19 ENCOUNTER — Encounter (HOSPITAL_BASED_OUTPATIENT_CLINIC_OR_DEPARTMENT_OTHER): Payer: Self-pay | Admitting: Orthopedic Surgery

## 2022-03-19 ENCOUNTER — Ambulatory Visit (HOSPITAL_BASED_OUTPATIENT_CLINIC_OR_DEPARTMENT_OTHER)
Admission: RE | Admit: 2022-03-19 | Discharge: 2022-03-19 | Disposition: A | Discharge status: Home / Self Care | Payer: Medicaid Other | Attending: Orthopedic Surgery | Admitting: Orthopedic Surgery

## 2022-03-19 DIAGNOSIS — G5602 Carpal tunnel syndrome, left upper limb: Secondary | ICD-10-CM | POA: Diagnosis not present

## 2022-03-19 DIAGNOSIS — F419 Anxiety disorder, unspecified: Secondary | ICD-10-CM | POA: Diagnosis not present

## 2022-03-19 DIAGNOSIS — Z01818 Encounter for other preprocedural examination: Secondary | ICD-10-CM

## 2022-03-19 HISTORY — PX: CARPAL TUNNEL RELEASE: SHX101

## 2022-03-19 LAB — POCT PREGNANCY, URINE: Preg Test, Ur: NEGATIVE

## 2022-03-19 SURGERY — CARPAL TUNNEL RELEASE
Anesthesia: Monitor Anesthesia Care | Site: Wrist | Laterality: Left

## 2022-03-19 MED ORDER — PROPOFOL 500 MG/50ML IV EMUL
INTRAVENOUS | Status: AC
  Filled 2022-03-19: qty 50

## 2022-03-19 MED ORDER — PROPOFOL 500 MG/50ML IV EMUL
INTRAVENOUS | Status: DC | PRN
  Administered 2022-03-19: 75 ug/kg/min via INTRAVENOUS

## 2022-03-19 MED ORDER — MIDAZOLAM HCL 2 MG/2ML IJ SOLN
INTRAMUSCULAR | Status: DC | PRN
  Administered 2022-03-19: 2 mg via INTRAVENOUS

## 2022-03-19 MED ORDER — LACTATED RINGERS IV SOLN: INTRAVENOUS | Status: DC

## 2022-03-19 MED ORDER — FENTANYL CITRATE (PF) 100 MCG/2ML IJ SOLN: 25.0000 ug | INTRAMUSCULAR | Status: DC | PRN

## 2022-03-19 MED ORDER — FENTANYL CITRATE (PF) 100 MCG/2ML IJ SOLN
INTRAMUSCULAR | Status: AC
  Filled 2022-03-19: qty 2

## 2022-03-19 MED ORDER — FENTANYL CITRATE (PF) 100 MCG/2ML IJ SOLN
INTRAMUSCULAR | Status: DC | PRN
  Administered 2022-03-19: 50 ug via INTRAVENOUS

## 2022-03-19 MED ORDER — BUPIVACAINE HCL 0.25 % IJ SOLN
INTRAMUSCULAR | Status: DC | PRN
  Administered 2022-03-19: 8 mL

## 2022-03-19 MED ORDER — BUPIVACAINE HCL (PF) 0.25 % IJ SOLN
INTRAMUSCULAR | Status: AC
  Filled 2022-03-19: qty 30

## 2022-03-19 MED ORDER — MIDAZOLAM HCL 2 MG/2ML IJ SOLN
INTRAMUSCULAR | Status: AC
  Filled 2022-03-19: qty 2

## 2022-03-19 MED ORDER — ONDANSETRON HCL 4 MG/2ML IJ SOLN
INTRAMUSCULAR | Status: DC | PRN
  Administered 2022-03-19: 4 mg via INTRAVENOUS

## 2022-03-19 MED ORDER — ONDANSETRON HCL 4 MG/2ML IJ SOLN
INTRAMUSCULAR | Status: AC
  Filled 2022-03-19: qty 2

## 2022-03-19 MED ORDER — ACETAMINOPHEN 10 MG/ML IV SOLN: 1000.0000 mg | Freq: Once | INTRAVENOUS | Status: DC | PRN

## 2022-03-19 MED ORDER — PROPOFOL 10 MG/ML IV BOLUS
INTRAVENOUS | Status: DC | PRN
  Administered 2022-03-19 (×3): 20 mg via INTRAVENOUS

## 2022-03-19 SURGICAL SUPPLY — 40 items
APL PRP STRL LF DISP 70% ISPRP (MISCELLANEOUS) ×1
BLADE SURG 15 STRL LF DISP TIS (BLADE) ×1 IMPLANT
BLADE SURG 15 STRL SS (BLADE) ×2
BNDG CMPR 9X4 STRL LF SNTH (GAUZE/BANDAGES/DRESSINGS) ×1
BNDG ELASTIC 3X5.8 VLCR STR LF (GAUZE/BANDAGES/DRESSINGS) ×2 IMPLANT
BNDG ESMARK 4X9 LF (GAUZE/BANDAGES/DRESSINGS) ×2 IMPLANT
BNDG GAUZE DERMACEA FLUFF (GAUZE/BANDAGES/DRESSINGS) ×1
BNDG GAUZE DERMACEA FLUFF 4 (GAUZE/BANDAGES/DRESSINGS) ×1 IMPLANT
BNDG GZE DERMACEA 4 6PLY (GAUZE/BANDAGES/DRESSINGS) ×1
BNDG PLASTER X FAST 3X3 WHT LF (CAST SUPPLIES) IMPLANT
BNDG PLSTR 9X3 FST ST WHT (CAST SUPPLIES)
CHLORAPREP W/TINT 26 (MISCELLANEOUS) ×2 IMPLANT
CORD BIPOLAR FORCEPS 12FT (ELECTRODE) ×2 IMPLANT
COVER BACK TABLE 60X90IN (DRAPES) ×2 IMPLANT
COVER MAYO STAND STRL (DRAPES) ×2 IMPLANT
CUFF TOURN SGL QUICK 18X4 (TOURNIQUET CUFF) IMPLANT
CUFF TOURN SGL QUICK 24 (TOURNIQUET CUFF)
CUFF TRNQT CYL 24X4X16.5-23 (TOURNIQUET CUFF) IMPLANT
DRAPE EXTREMITY T 121X128X90 (DISPOSABLE) ×2 IMPLANT
DRAPE SURG 17X23 STRL (DRAPES) ×2 IMPLANT
GAUZE XEROFORM 1X8 LF (GAUZE/BANDAGES/DRESSINGS) ×2 IMPLANT
GLOVE BIO SURGEON STRL SZ7 (GLOVE) ×2 IMPLANT
GLOVE BIOGEL PI IND STRL 7.0 (GLOVE) ×1 IMPLANT
GLOVE BIOGEL PI INDICATOR 7.0 (GLOVE) ×1
GOWN STRL REUS W/ TWL LRG LVL3 (GOWN DISPOSABLE) ×2 IMPLANT
GOWN STRL REUS W/TWL LRG LVL3 (GOWN DISPOSABLE) ×4
NDL HYPO 25X1 1.5 SAFETY (NEEDLE) IMPLANT
NEEDLE HYPO 25X1 1.5 SAFETY (NEEDLE) IMPLANT
NS IRRIG 1000ML POUR BTL (IV SOLUTION) ×2 IMPLANT
PACK BASIN DAY SURGERY FS (CUSTOM PROCEDURE TRAY) ×2 IMPLANT
PAD CAST 3X4 CTTN HI CHSV (CAST SUPPLIES) ×1 IMPLANT
PADDING CAST COTTON 3X4 STRL (CAST SUPPLIES) ×2
SLEEVE SCD COMPRESS KNEE MED (STOCKING) IMPLANT
SUT ETHILON 4 0 PS 2 18 (SUTURE) ×2 IMPLANT
SUT MNCRL AB 3-0 PS2 18 (SUTURE) IMPLANT
SUT VICRYL 4-0 PS2 18IN ABS (SUTURE) IMPLANT
SYR BULB EAR ULCER 3OZ GRN STR (SYRINGE) ×2 IMPLANT
SYR CONTROL 10ML LL (SYRINGE) ×2 IMPLANT
TOWEL GREEN STERILE FF (TOWEL DISPOSABLE) ×4 IMPLANT
UNDERPAD 30X36 HEAVY ABSORB (UNDERPADS AND DIAPERS) ×2 IMPLANT

## 2022-03-19 NOTE — Anesthesia Postprocedure Evaluation (Signed)
Anesthesia Post Note  Patient: Alyssa Blackwell  Procedure(s) Performed: LEFT CARPAL TUNNEL RELEASE (Left: Wrist)     Patient location during evaluation: PACU Anesthesia Type: MAC Level of consciousness: awake and alert Pain management: pain level controlled Vital Signs Assessment: post-procedure vital signs reviewed and stable Respiratory status: spontaneous breathing, nonlabored ventilation, respiratory function stable and patient connected to nasal cannula oxygen Cardiovascular status: stable and blood pressure returned to baseline Postop Assessment: no apparent nausea or vomiting Anesthetic complications: no   No notable events documented.  Last Vitals:  Vitals:   03/19/22 1327 03/19/22 1340  BP:  119/82  Pulse: (!) 52 (!) 52  Resp: 15 14  Temp:  36.6 C  SpO2: 100% 100%    Last Pain:  Vitals:   03/19/22 1340  TempSrc:   PainSc: 0-No pain                 Belenda Cruise P Orvis Stann

## 2022-03-19 NOTE — Transfer of Care (Signed)
Immediate Anesthesia Transfer of Care Note  Patient: Alyssa Blackwell  Procedure(s) Performed: LEFT CARPAL TUNNEL RELEASE (Left: Wrist)  Patient Location: PACU  Anesthesia Type:MAC  Level of Consciousness: drowsy  Airway & Oxygen Therapy: Patient Spontanous Breathing and Patient connected to face mask oxygen  Post-op Assessment: Report given to RN and Post -op Vital signs reviewed and stable  Post vital signs: Reviewed and stable  Last Vitals:   BP: 114/72 (86) HR: 63 SpO2: 100% RR: 20 Vitals Value Taken Time  BP    Temp    Pulse    Resp    SpO2      Last Pain:  Vitals:   03/19/22 1116  TempSrc: Oral  PainSc: 0-No pain         Complications: No notable events documented.

## 2022-03-19 NOTE — H&P (Signed)
   HAND SURGERY  REQUESTING PHYSICIAN: Marlyne Beards, MD    HPI: Alyssa Blackwell is a 36 y.o. female who presents with left carpal tunnel syndrome that has failed conservative mangagment and was confirmed on electrodiagnostic studies.  She is having both day and nocturnal symptoms.  This is severely interfering with her work and daily activities.    Past Medical History:  Diagnosis Date   Arrhythmia    occurs only in pregnancy, had evaluation in [place] with 5th pregnancy, normal evaluation per pt   Panic attacks    Past Surgical History:  Procedure Laterality Date   CARPAL TUNNEL RELEASE Right 01/15/2022   Procedure: RIGHT CARPAL TUNNEL RELEASE;  Surgeon: Marlyne Beards, MD;  Location: [facility];  Service: Orthopedics;  Laterality: Right;   Social History   Socioeconomic History   Marital status: Married    Spouse name: Not on file   Number of children: Not on file   Years of education: Not on file   Highest education level: Not on file  Occupational History   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  Tobacco Use   Smoking status: Never   Smokeless tobacco: Never  Vaping Use   Vaping Use: Never used  Substance and Sexual Activity   Alcohol use: Never   Drug use: Never   Sexual activity: Yes    Birth control/protection: None  Other Topics Concern   Not on file  Social History Narrative   Not on file   Social Determinants of Health   Financial Resource Strain: Not on file  Food Insecurity: Food Insecurity Present (04/19/2021)   Hunger Vital Sign    Worried About Running Out of Food in the Last Year: Sometimes true    Ran Out of Food in the Last Year: Sometimes true  Transportation Needs: No Transportation Needs (04/19/2021)   PRAPARE - Administrator, Civil Service (Medical): No    Lack of Transportation (Non-Medical): No  Physical Activity: Not on file  Stress: Not on file  Social Connections: Not on file   Family History   Problem Relation Age of Onset   Heart attack Father    Heart disease Father    Hypertension Paternal Grandmother    Healthy Mother    - negative except otherwise stated in the family history section No Known Allergies Prior to Admission medications   Not on File   No results found. - pertinent xrays, CT, MRI studies were reviewed and independently interpreted  Positive ROS: All other systems have been reviewed and were otherwise negative with the exception of those mentioned in the HPI and as above.  Physical Exam: General: No acute distress, resting comfortably Cardiovascular: No pedal edema Respiratory: No cyanosis, no use of accessory musculature Skin: No lesions in the area of chief complaint Neurologic: Sensation intact distally Psychiatric: Patient is at baseline mood and affect  Left Upper Extremity + Tinel, Phalen signs 5/5 thenar motor strength Hand warm and well perfused   Assessment: 36 yo F w/ L CTS that has failed conservative management  Plan: OR today for L CTR Reviewed risks of surgery including bleeding, infection, damage to neurovascular structures, incomplete symptom relief, need for additional surgery Discharge to home from PACU    Marlyne Beards, M.D. [facility] 12:25 PM

## 2022-03-19 NOTE — Discharge Instructions (Addendum)
 Alyssa Blackwell, M.D. Hand Surgery  POST-OPERATIVE DISCHARGE INSTRUCTIONS   PRESCRIPTIONS: - You have been given a prescription to be taken as directed for post-operative pain control.  You may also take over the counter ibuprofen/aleve and tylenol for pain. Take this as directed on the packaging. Do not exceed 3000 mg tylenol/acetaminophen in 24 hours.  Ibuprofen 600-800 mg (3-4) tablets by mouth every 6 hours as needed for pain.   OR  Aleve 2 tablets by mouth every 12 hours (twice daily) as needed for pain.   AND/OR  Tylenol 1000 mg (2 tablets) every 8 hours as needed for pain.  - Please use your pain medication carefully, as refills are limited and you may not be provided with one.  As stated above, please use over the counter pain medicine - it will also be helpful with decreasing your swelling.    ANESTHESIA: -After your surgery, post-surgical discomfort or pain is likely. This discomfort can last several days to a few weeks. At certain times of the day your discomfort may be more intense.   Did you receive a nerve block?   - A nerve block can provide pain relief for one hour to two days after your surgery. As long as the nerve block is working, you will experience little or no sensation in the area the surgeon operated on.  - As the nerve block wears off, you will begin to experience pain or discomfort. It is very important that you begin taking your prescribed pain medication before the nerve block fully wears off. Treating your pain at the first sign of the block wearing off will ensure your pain is better controlled and more tolerable when full-sensation returns. Do not wait until the pain is intolerable, as the medicine will be less effective. It is better to treat pain in advance than to try and catch up.   General Anesthesia:  If you did not receive a nerve block during your surgery, you will need to start taking your pain medication shortly after your surgery and  should continue to do so as prescribed by your surgeon.     ICE AND ELEVATION: - You may use ice for the first 48-72 hours, but it is not critical.   - Motion of your fingers is very important to decrease the swelling.  - Elevation, as much as possible for the next 48 hours, is critical for decreasing swelling as well as for pain relief. Elevation means when you are seated or lying down, you hand should be at or above your heart. When walking, the hand needs to be at or above the level of your elbow.  - If the bandage gets too tight, it may need to be loosened. Please contact our office and we will instruct you in how to do this.    SURGICAL BANDAGES:  - Keep your dressing and/or splint clean and dry at all times.  You can remove your dressing 4 days from now and change with a dry dressing or Band-Aids as needed thereafter. - You may place a plastic bag over your bandage to shower, but be careful, do not get your bandages wet.  - After the bandages have been removed, it is OK to get the stitches wet in a shower or with hand washing. Do Not soak or submerge the wound yet. Please do not use lotions or creams on the stitches.      HAND THERAPY:  - You may not need any. If you do,   we will begin this at your follow up visit in the clinic.    ACTIVITY AND WORK: - You are encouraged to move any fingers which are not in the bandage.  - Light use of the fingers is allowed to assist the other hand with daily hygiene and eating, but strong gripping or lifting is often uncomfortable and should be avoided.  - You might miss a variable period of time from work and hopefully this issue has been discussed prior to surgery. You may not do any heavy work with your affected hand for about 2 weeks.    [facility] [address] [place],  [place]  [address] [phone]      Post Anesthesia Home Care Instructions  Activity: Get plenty of rest for the remainder of the day. A  responsible individual must stay with you for 24 hours following the procedure.  For the next 24 hours, DO NOT: -Drive a car -Advertising copywriter -Drink alcoholic beverages -Take any medication unless instructed by your physician -Make any legal decisions or sign important papers.  Meals: Start with liquid foods such as gelatin or soup. Progress to regular foods as tolerated. Avoid greasy, spicy, heavy foods. If nausea and/or vomiting occur, drink only clear liquids until the nausea and/or vomiting subsides. Call your physician if vomiting continues.  Special Instructions/Symptoms: Your throat may feel dry or sore from the anesthesia or the breathing tube placed in your throat during surgery. If this causes discomfort, gargle with warm salt water. The discomfort should disappear within 24 hours.  If you had a scopolamine patch placed behind your ear for the management of post- operative nausea and/or vomiting:  1. The medication in the patch is effective for 72 hours, after which it should be removed.  Wrap patch in a tissue and discard in the trash. Wash hands thoroughly with soap and water. 2. You may remove the patch earlier than 72 hours if you experience unpleasant side effects which may include dry mouth, dizziness or visual disturbances. 3. Avoid touching the patch. Wash your hands with soap and water after contact with the patch.     Post Anesthesia Home Care Instructions  Activity: Get plenty of rest for the remainder of the day. A responsible individual must stay with you for 24 hours following the procedure.  For the next 24 hours, DO NOT: -Drive a car -Advertising copywriter -Drink alcoholic beverages -Take any medication unless instructed by your physician -Make any legal decisions or sign important papers.  Meals: Start with liquid foods such as gelatin or soup. Progress to regular foods as tolerated. Avoid greasy, spicy, heavy foods. If nausea and/or vomiting occur, drink only  clear liquids until the nausea and/or vomiting subsides. Call your physician if vomiting continues.  Special Instructions/Symptoms: Your throat may feel dry or sore from the anesthesia or the breathing tube placed in your throat during surgery. If this causes discomfort, gargle with warm salt water. The discomfort should disappear within 24 hours.  If you had a scopolamine patch placed behind your ear for the management of post- operative nausea and/or vomiting:  1. The medication in the patch is effective for 72 hours, after which it should be removed.  Wrap patch in a tissue and discard in the trash. Wash hands thoroughly with soap and water. 2. You may remove the patch earlier than 72 hours if you experience unpleasant side effects which may include dry mouth, dizziness or visual disturbances. 3. Avoid touching the patch. Wash  your hands with soap and water after contact with the patch.    Post Anesthesia Home Care Instructions  Activity: Get plenty of rest for the remainder of the day. A responsible individual must stay with you for 24 hours following the procedure.  For the next 24 hours, DO NOT: -Drive a car -Advertising copywriter -Drink alcoholic beverages -Take any medication unless instructed by your physician -Make any legal decisions or sign important papers.  Meals: Start with liquid foods such as gelatin or soup. Progress to regular foods as tolerated. Avoid greasy, spicy, heavy foods. If nausea and/or vomiting occur, drink only clear liquids until the nausea and/or vomiting subsides. Call your physician if vomiting continues.  Special Instructions/Symptoms: Your throat may feel dry or sore from the anesthesia or the breathing tube placed in your throat during surgery. If this causes discomfort, gargle with warm salt water. The discomfort should disappear within 24 hours.  If you had a scopolamine patch placed behind your ear for the management of post- operative nausea and/or  vomiting:  1. The medication in the patch is effective for 72 hours, after which it should be removed.  Wrap patch in a tissue and discard in the trash. Wash hands thoroughly with soap and water. 2. You may remove the patch earlier than 72 hours if you experience unpleasant side effects which may include dry mouth, dizziness or visual disturbances. 3. Avoid touching the patch. Wash your hands with soap and water after contact with the patch.    Post Anesthesia Home Care Instructions  Activity: Get plenty of rest for the remainder of the day. A responsible individual must stay with you for 24 hours following the procedure.  For the next 24 hours, DO NOT: -Drive a car -Advertising copywriter -Drink alcoholic beverages -Take any medication unless instructed by your physician -Make any legal decisions or sign important papers.  Meals: Start with liquid foods such as gelatin or soup. Progress to regular foods as tolerated. Avoid greasy, spicy, heavy foods. If nausea and/or vomiting occur, drink only clear liquids until the nausea and/or vomiting subsides. Call your physician if vomiting continues.  Special Instructions/Symptoms: Your throat may feel dry or sore from the anesthesia or the breathing tube placed in your throat during surgery. If this causes discomfort, gargle with warm salt water. The discomfort should disappear within 24 hours.  If you had a scopolamine patch placed behind your ear for the management of post- operative nausea and/or vomiting:  1. The medication in the patch is effective for 72 hours, after which it should be removed.  Wrap patch in a tissue and discard in the trash. Wash hands thoroughly with soap and water. 2. You may remove the patch earlier than 72 hours if you experience unpleasant side effects which may include dry mouth, dizziness or visual disturbances. 3. Avoid touching the patch. Wash your hands with soap and water after contact with the patch.    Post  Anesthesia Home Care Instructions  Activity: Get plenty of rest for the remainder of the day. A responsible individual must stay with you for 24 hours following the procedure.  For the next 24 hours, DO NOT: -Drive a car -Advertising copywriter -Drink alcoholic beverages -Take any medication unless instructed by your physician -Make any legal decisions or sign important papers.  Meals: Start with liquid foods such as gelatin or soup. Progress to regular foods as tolerated. Avoid greasy, spicy, heavy foods. If nausea and/or vomiting occur, drink only clear liquids until  the nausea and/or vomiting subsides. Call your physician if vomiting continues.  Special Instructions/Symptoms: Your throat may feel dry or sore from the anesthesia or the breathing tube placed in your throat during surgery. If this causes discomfort, gargle with warm salt water. The discomfort should disappear within 24 hours.  If you had a scopolamine patch placed behind your ear for the management of post- operative nausea and/or vomiting:  1. The medication in the patch is effective for 72 hours, after which it should be removed.  Wrap patch in a tissue and discard in the trash. Wash hands thoroughly with soap and water. 2. You may remove the patch earlier than 72 hours if you experience unpleasant side effects which may include dry mouth, dizziness or visual disturbances. 3. Avoid touching the patch. Wash your hands with soap and water after contact with the patch.      Call your surgeon if you experience:   1.  Fever over 101.0. 2.  Inability to urinate. 3.  Nausea and/or vomiting. 4.  Extreme swelling or bruising at the surgical site. 5.  Continued bleeding from the incision. 6.  Increased pain, redness or drainage from the incision. 7.  Problems related to your pain medication. 8.  Any problems and/or concerns

## 2022-03-19 NOTE — Interval H&P Note (Signed)
History and Physical Interval Note:  03/19/2022 12:27 PM  Alyssa Blackwell  has presented today for surgery, with the diagnosis of LEFT CARPAL TUNNEL SYNDROME.  The various methods of treatment have been discussed with the patient and family. After consideration of risks, benefits and other options for treatment, the patient has consented to  Procedure(s): LEFT CARPAL TUNNEL RELEASE (Left) as a surgical intervention.  The patient's history has been reviewed, patient examined, no change in status, stable for surgery.  I have reviewed the patient's chart and labs.  Questions were answered to the patient's satisfaction.     Anwen Cannedy Romar Woodrick

## 2022-03-19 NOTE — Anesthesia Preprocedure Evaluation (Addendum)
Anesthesia Evaluation  Patient identified by MRN, date of birth, ID band Patient awake    Reviewed: Allergy & Precautions, NPO status , Patient's Chart, lab work & pertinent test results  Airway Mallampati: II  TM Distance: >3 FB Neck ROM: Full    Dental no notable dental hx.    Pulmonary neg pulmonary ROS,    Pulmonary exam normal        Cardiovascular negative cardio ROS   Rhythm:Regular Rate:Normal     Neuro/Psych Anxiety negative neurological ROS     GI/Hepatic negative GI ROS, Neg liver ROS,   Endo/Other  negative endocrine ROS  Renal/GU negative Renal ROS  negative genitourinary   Musculoskeletal Carpal tunnel syndrome   Abdominal Normal abdominal exam  (+)   Peds  Hematology negative hematology ROS (+)   Anesthesia Other Findings   Reproductive/Obstetrics                             Anesthesia Physical Anesthesia Plan  ASA: 1  Anesthesia Plan: MAC   Post-op Pain Management:    Induction: Intravenous  PONV Risk Score and Plan: 2 and Ondansetron, Dexamethasone, Midazolam and Treatment may vary due to age or medical condition  Airway Management Planned: Simple Face Mask, Natural Airway and Nasal Cannula  Additional Equipment: None  Intra-op Plan:   Post-operative Plan:   Informed Consent: I have reviewed the patients History and Physical, chart, labs and discussed the procedure including the risks, benefits and alternatives for the proposed anesthesia with the patient or authorized representative who has indicated his/her understanding and acceptance.     Dental advisory given and Interpreter used for interveiw  Plan Discussed with: CRNA  Anesthesia Plan Comments: (Lab Results      Component                Value               Date                      PREGTESTUR               NEGATIVE            03/19/2022                HCG                      <5.0                 04/17/2021          )       Anesthesia Quick Evaluation

## 2022-03-19 NOTE — Op Note (Signed)
   Date of Surgery: 03/19/2022  INDICATIONS: Patient is a 36 y.o.-year-old female with left carpal tunnel syndrome that was confirmed on electrodiagnostic studies and has failed conservative management.  Risks, benefits, and alternatives to surgery were again discussed with the patient in the preoperative area. The patient wishes to proceed with surgery.  Informed consent was signed after our discussion.   PREOPERATIVE DIAGNOSIS:  Left carpal tunnel syndrome  POSTOPERATIVE DIAGNOSIS: Same.  PROCEDURE:  Left carpal tunnel release   SURGEON: Audria Nine, M.D.  ASSIST:   ANESTHESIA:  Local, MAC  IV FLUIDS AND URINE: See anesthesia.  ESTIMATED BLOOD LOSS: <5 mL.  IMPLANTS: * No implants in log *   DRAINS: None  COMPLICATIONS: None  DESCRIPTION OF PROCEDURE: The patient was met in the preoperative holding area where the surgical site was marked and the consent form was verified.  The patient was then taken to the operating room and transferred to the operating table.  All bony prominences were well padded.  A tourniquet was applied to the left forearm.  Monitored sedation was induced.   A formal time-out was performed to confirm that this was the correct patient, surgery, side, and site. A local block was performed using 10cc of 0.25% plain marcaine. The operative extremity was prepped and draped in the usual and sterile fashion.    Following a second timeout, the limb was exsanguinated and the tourniquet inflated to 250 mmHg.  A longitudinal incision was made in line with the radial border of the ring finger from distal to the wrist flexion crease to the intersection of Kaplan's cardinal line.  The skin and subcutaneous tissue was sharply divided.  The longitudinally running palmar fascia was incised.  The thenar musculature was bluntly swept off of the transverse carpal ligament.  The ligament was divided from proximal to distal until the fat surrounding the palmar arch was  encountered. Retractors were then placed in the proximal aspect of the wound to visualize the distal antebrachial fascia.  The fascia was sharply divided under direct visualization.   The wound was then thoroughly irrigated with sterile saline.  The tourniquet was deflated.  Hemostasis was achieved with direct pressure and bipolar electrocautery.  The wound was then closed with 4-0 nylon sutures in a horizontal mattress fashion. The wound was then dressed with xeroform, folded kerlix, and an ace wrap.  The patient was then reversed from anesthesia and transferred to the postoperative bed.  All counts were correct x 2 at the end of the procedure.  The patient was taken to the recovery unit in stable condition.      POSTOPERATIVE PLAN: She will be discharged to home with appropriate pain medication and discharge instructions. I'll see her back in 10-14 days for her first postop visit.   Audria Nine, MD 1:04 PM

## 2022-03-20 ENCOUNTER — Encounter (HOSPITAL_BASED_OUTPATIENT_CLINIC_OR_DEPARTMENT_OTHER): Payer: Self-pay | Admitting: Orthopedic Surgery

## 2022-03-21 ENCOUNTER — Other Ambulatory Visit: Payer: Self-pay | Admitting: Family Medicine

## 2022-03-21 ENCOUNTER — Other Ambulatory Visit: Payer: Self-pay | Admitting: Orthopedic Surgery

## 2022-03-21 DIAGNOSIS — J018 Other acute sinusitis: Secondary | ICD-10-CM

## 2022-03-21 MED ORDER — TRAMADOL HCL 50 MG PO TABS: 50.0000 mg | ORAL_TABLET | Freq: Four times a day (QID) | ORAL | 0 refills | Status: AC | PRN

## 2022-03-29 ENCOUNTER — Ambulatory Visit (INDEPENDENT_AMBULATORY_CARE_PROVIDER_SITE_OTHER): Payer: Medicaid Other | Admitting: Orthopedic Surgery

## 2022-03-29 ENCOUNTER — Encounter: Payer: Self-pay | Admitting: Orthopedic Surgery

## 2022-03-29 ENCOUNTER — Other Ambulatory Visit: Payer: Self-pay | Admitting: Family Medicine

## 2022-03-29 DIAGNOSIS — G5602 Carpal tunnel syndrome, left upper limb: Secondary | ICD-10-CM

## 2022-03-29 DIAGNOSIS — J018 Other acute sinusitis: Secondary | ICD-10-CM

## 2022-03-29 MED ORDER — CETIRIZINE HCL 10 MG PO TABS: 10.0000 mg | ORAL_TABLET | Freq: Every day | ORAL | 0 refills | Status: DC

## 2022-03-29 NOTE — Progress Notes (Signed)
   Post-Op Visit Note   Patient: Alyssa Blackwell           Date of Birth: Jun 19, 1986           MRN: [id number] Visit Date: 03/29/2022 PCP: Hoy Register, MD   Assessment & Plan:  Chief Complaint:  Chief Complaint  Patient presents with   Left Wrist - Routine Post Op   Visit Diagnoses:  1. Carpal tunnel syndrome, left upper limb     Plan: Patient is 10 days s/p L CTR.  She is doing well.  She described some "dark, black pus" draining from the proximal portion of her incision.  She removed her sutures with a knife.  There is no erythema at all over the incision today.  The incision is well approximated without any evidence of wound dehiscence.  Her numbness and paresthesias, including at night, have resolved.  She has full thenar motor function.  We discussed keeping the incision site clean and dry.  She can follow up with me again as needed.   Follow-Up Instructions: No follow-ups on file.   Orders:  No orders of the defined types were placed in this encounter.  No orders of the defined types were placed in this encounter.   Imaging: No results found.  PMFS History: Patient Active Problem List   Diagnosis Date Noted   Carpal tunnel syndrome, right upper limb    Carpal tunnel syndrome, left upper limb 11/17/2021   Bilateral hand numbness 10/05/2021   ASCUS with positive high risk HPV cervical 03/13/2021   Tachycardia 12/29/2020   Vulvovaginitis 10/10/2020   Language barrier 10/10/2020   Dental abscess 10/10/2020   History of gestational diabetes in prior pregnancy, currently pregnant 08/12/2020   Past Medical History:  Diagnosis Date   Arrhythmia    occurs only in pregnancy, had evaluation in [place] with 5th pregnancy, normal evaluation per pt   Panic attacks     Family History  Problem Relation Age of Onset   Heart attack Father    Heart disease Father    Hypertension Paternal Grandmother    Healthy Mother     Past Surgical History:  Procedure  Laterality Date   CARPAL TUNNEL RELEASE Right 01/15/2022   Procedure: RIGHT CARPAL TUNNEL RELEASE;  Surgeon: Marlyne Beards, MD;  Location: [facility];  Service: Orthopedics;  Laterality: Right;   CARPAL TUNNEL RELEASE Left 03/19/2022   Procedure: LEFT CARPAL TUNNEL RELEASE;  Surgeon: Marlyne Beards, MD;  Location: [facility];  Service: Orthopedics;  Laterality: Left;   Social History   Occupational History   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  Tobacco Use   Smoking status: Never   Smokeless tobacco: Never  Vaping Use   Vaping Use: Never used  Substance and Sexual Activity   Alcohol use: Never   Drug use: Never   Sexual activity: Yes    Birth control/protection: None

## 2022-04-06 DIAGNOSIS — Z419 Encounter for procedure for purposes other than remedying health state, unspecified: Secondary | ICD-10-CM | POA: Diagnosis not present

## 2022-04-09 IMAGING — CT CT HEAD W/O CM
3 series · 15 of 47 positions shown, 18 images · non-contrast
Comparison: None.

CLINICAL DATA: Neck trauma.

EXAM:
CT HEAD WITHOUT CONTRAST
CT CERVICAL SPINE WITHOUT CONTRAST
TECHNIQUE: Multidetector CT imaging of the head and cervical spine was
performed following the standard protocol without intravenous
contrast. Multiplanar CT image reconstructions of the cervical spine
were also generated.

[Series 4: head 5.0 h30s · axial · 0.46mm/px · z∈[-141,-1]mm · 9 of 34 slices shown, 12 images]
[im 3/34  brain]
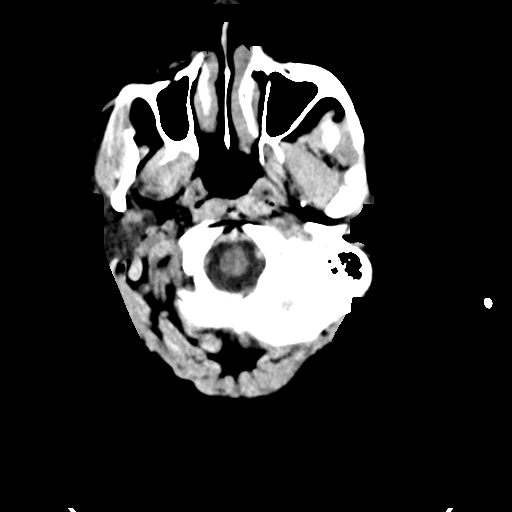
[im 3/34  bone]
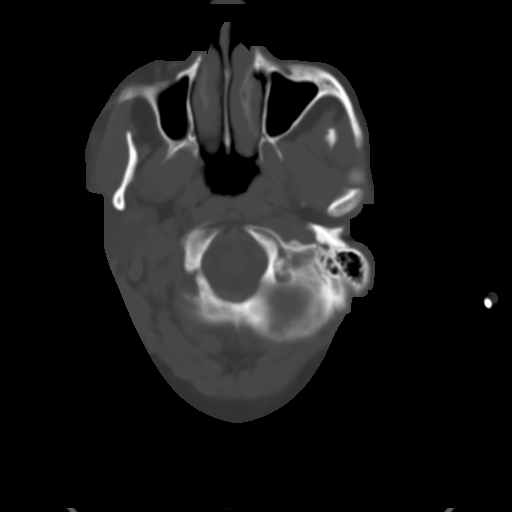
[im 6/34  brain]
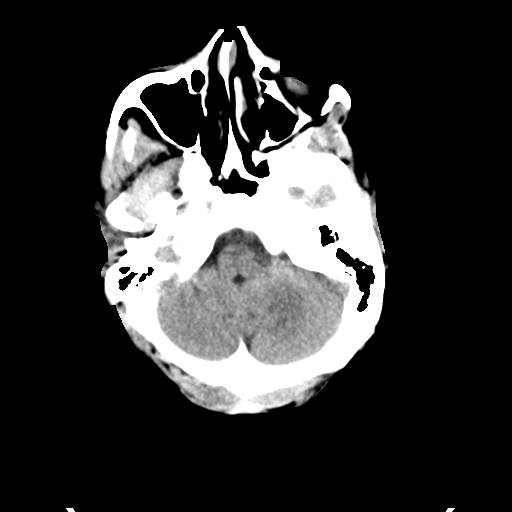
[im 10/34  brain]
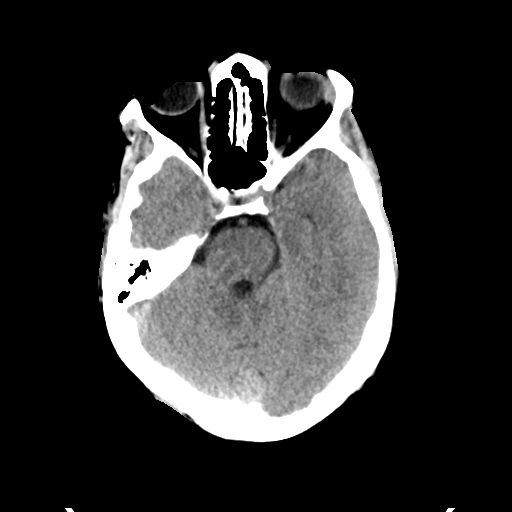
[im 13/34  brain]
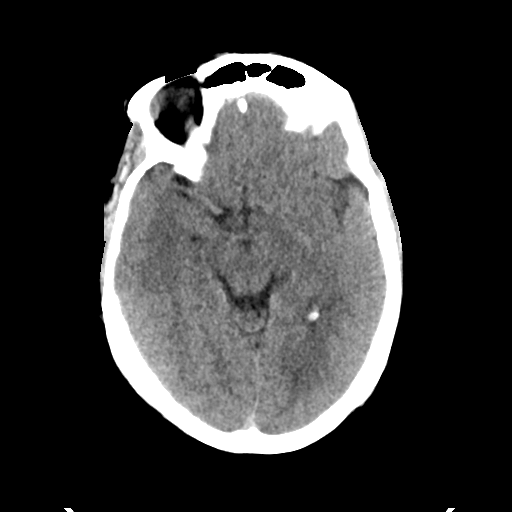
[im 18/34  brain]
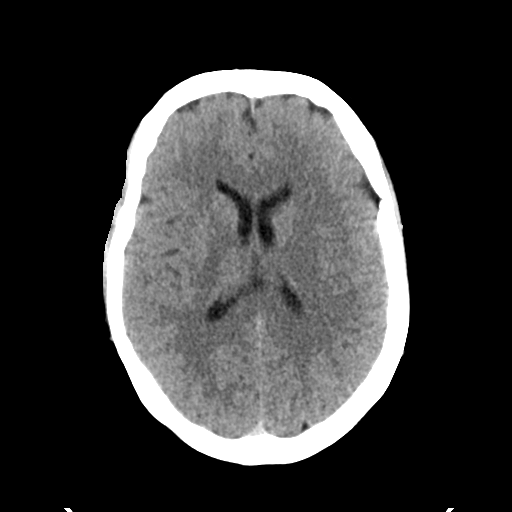
[im 18/34  bone]
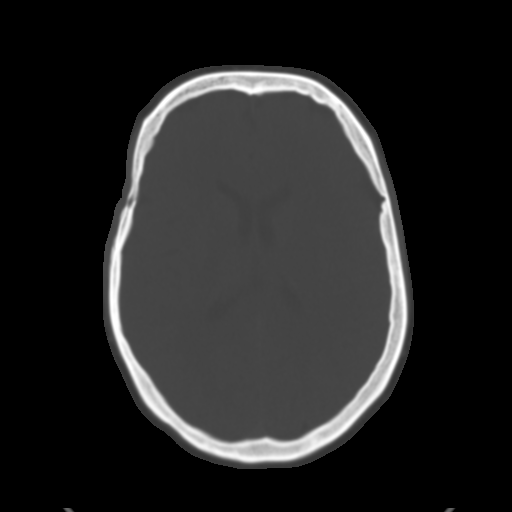
[im 21/34  brain]
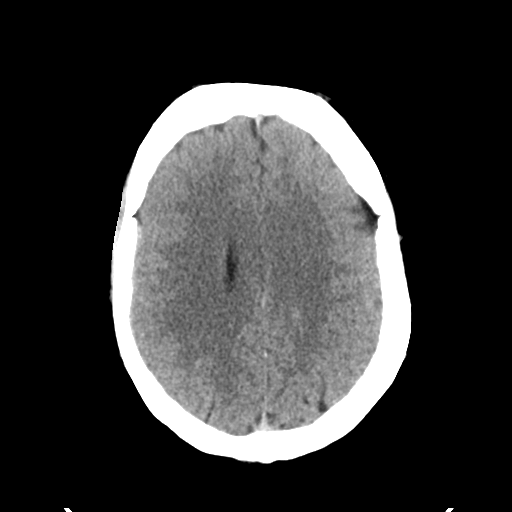
[im 24/34  brain]
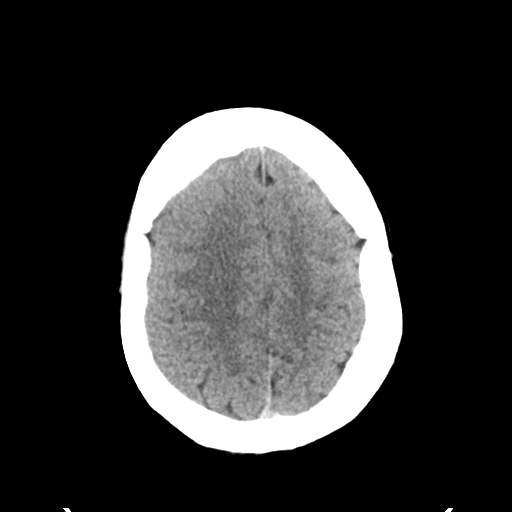
[im 28/34  brain]
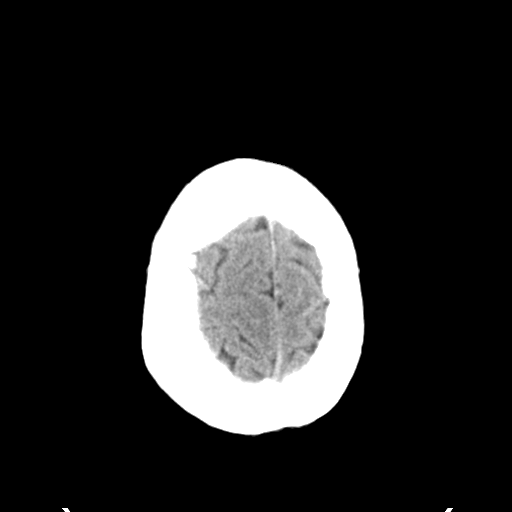
[im 31/34  brain]
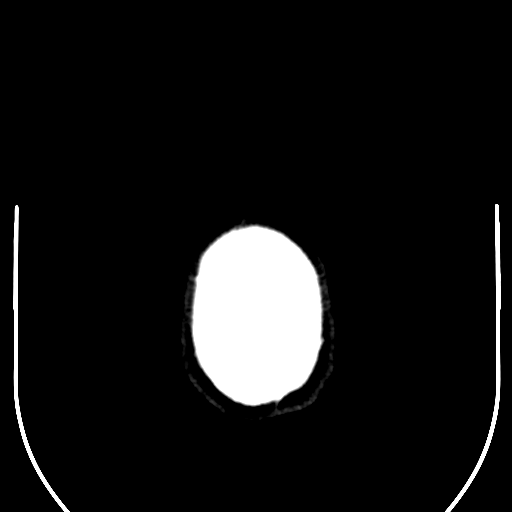
[im 31/34  bone]
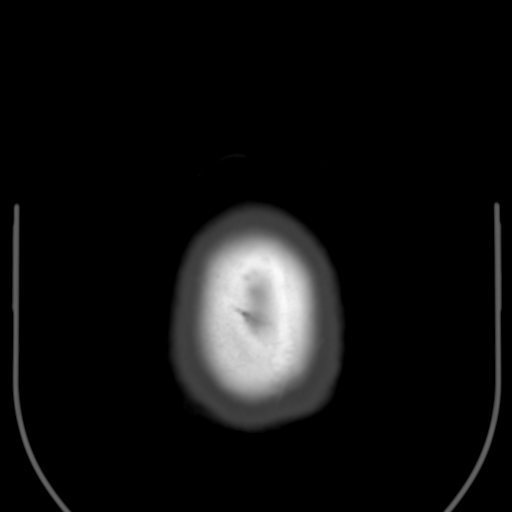

[Series 5: head 3.0 mpr cor · coronal · 0.32mm/px · 3 of 70 slices shown]
[im 24/70  brain]
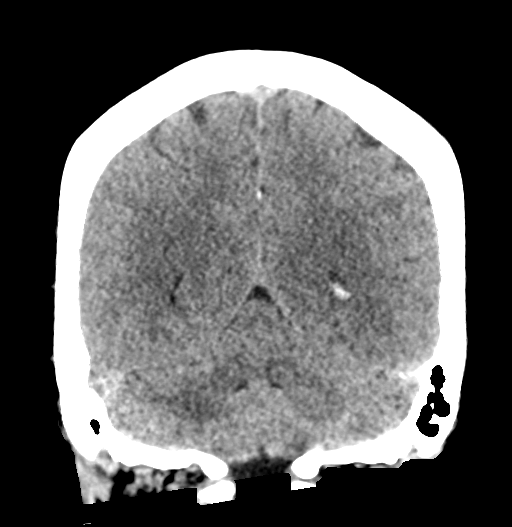
[im 31/70  brain]
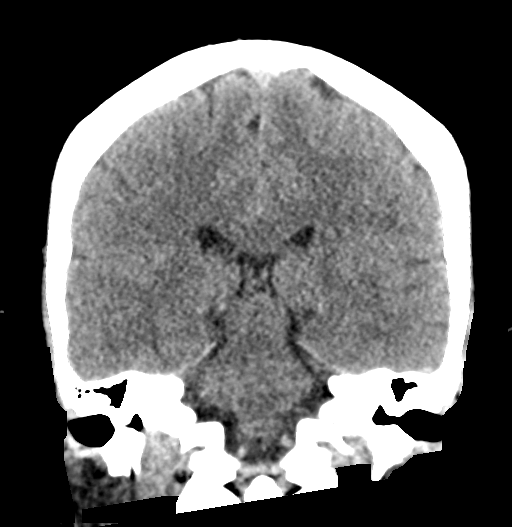
[im 39/70  brain]
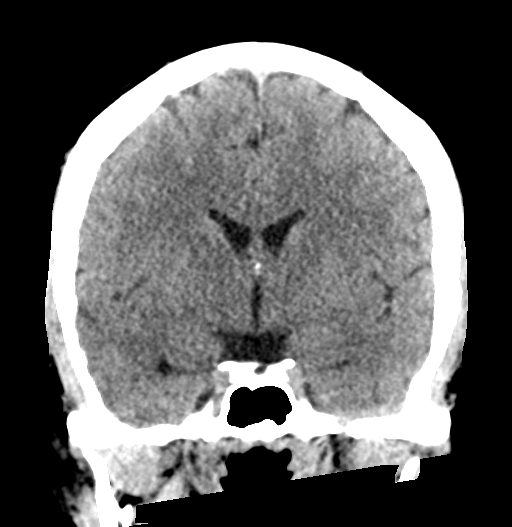

[Series 6: head 3.0 mpr sag · sagittal · 0.33mm/px · 3 of 54 slices shown]
[im 18/54  brain]
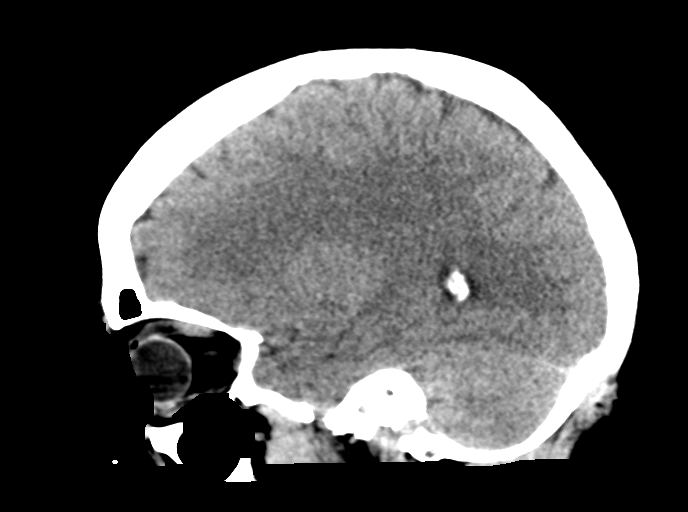
[im 27/54  brain]
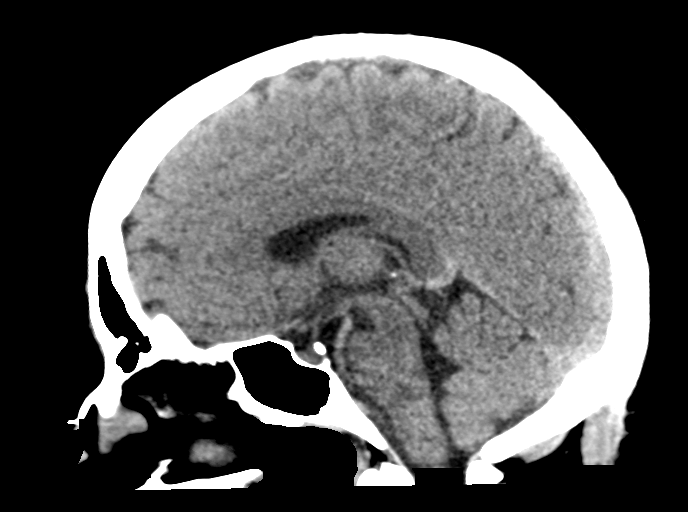
[im 36/54  brain]
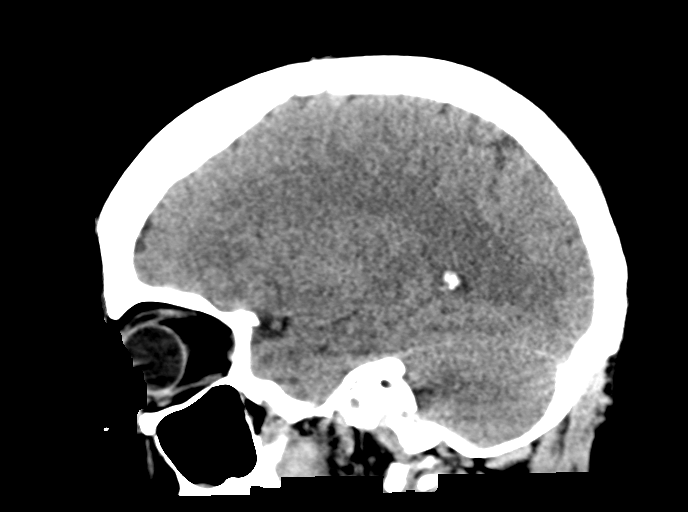

[15 of 47 positions shown; findings below may reference images not displayed]

FINDINGS: CT HEAD FINDINGS

Brain: No evidence of acute infarction, hemorrhage, hydrocephalus,
extra-axial collection or mass lesion/mass effect.

Vascular: No hyperdense vessel or unexpected calcification.

Skull: Normal. Negative for fracture or focal lesion.

Sinuses/Orbits: No acute finding.

Other: None.

CT CERVICAL SPINE FINDINGS

Alignment: Normal.

Skull base and vertebrae: No acute fracture. No primary bone lesion
or focal pathologic process.

Soft tissues and spinal canal: No prevertebral fluid or swelling. No
visible canal hematoma.

Disc levels: No significant central canal or neural foraminal
stenosis at any level.

Upper chest: Negative.

Other: None.
IMPRESSION: No acute intracranial process.

No acute fracture or traumatic subluxation of the cervical spine.

## 2022-04-19 ENCOUNTER — Ambulatory Visit: Payer: Medicaid Other

## 2022-04-29 ENCOUNTER — Emergency Department (HOSPITAL_COMMUNITY)
Admission: EM | Admit: 2022-04-29 | Discharge: 2022-04-29 | Disposition: A | Discharge status: Home / Self Care | Payer: Medicaid Other | Attending: Emergency Medicine | Admitting: Emergency Medicine

## 2022-04-29 ENCOUNTER — Encounter (HOSPITAL_COMMUNITY): Payer: Self-pay

## 2022-04-29 ENCOUNTER — Other Ambulatory Visit: Payer: Self-pay

## 2022-04-29 DIAGNOSIS — J029 Acute pharyngitis, unspecified: Secondary | ICD-10-CM | POA: Diagnosis not present

## 2022-04-29 DIAGNOSIS — R509 Fever, unspecified: Secondary | ICD-10-CM | POA: Insufficient documentation

## 2022-04-29 DIAGNOSIS — Z20822 Contact with and (suspected) exposure to covid-19: Secondary | ICD-10-CM | POA: Insufficient documentation

## 2022-04-29 DIAGNOSIS — H9201 Otalgia, right ear: Secondary | ICD-10-CM | POA: Insufficient documentation

## 2022-04-29 DIAGNOSIS — R519 Headache, unspecified: Secondary | ICD-10-CM | POA: Insufficient documentation

## 2022-04-29 DIAGNOSIS — M791 Myalgia, unspecified site: Secondary | ICD-10-CM | POA: Diagnosis not present

## 2022-04-29 DIAGNOSIS — R1310 Dysphagia, unspecified: Secondary | ICD-10-CM | POA: Insufficient documentation

## 2022-04-29 LAB — GROUP A STREP BY PCR: Group A Strep by PCR: NOT DETECTED

## 2022-04-29 LAB — SARS CORONAVIRUS 2 BY RT PCR: SARS Coronavirus 2 by RT PCR: NEGATIVE

## 2022-04-29 MED ORDER — CEFDINIR 300 MG PO CAPS: 300.0000 mg | ORAL_CAPSULE | Freq: Two times a day (BID) | ORAL | 0 refills | Status: AC

## 2022-04-29 MED ORDER — IBUPROFEN 400 MG PO TABS: 400.0000 mg | ORAL_TABLET | Freq: Four times a day (QID) | ORAL | 0 refills | Status: DC

## 2022-04-29 MED ORDER — IBUPROFEN 400 MG PO TABS
600.0000 mg | ORAL_TABLET | Freq: Once | ORAL | Status: AC
  Administered 2022-04-29: 600 mg via ORAL
  Filled 2022-04-29: qty 1

## 2022-04-29 MED ORDER — ACETAMINOPHEN 500 MG PO TABS: 500.0000 mg | ORAL_TABLET | Freq: Four times a day (QID) | ORAL | 0 refills | Status: AC

## 2022-04-29 MED ORDER — CEFDINIR 300 MG PO CAPS
300.0000 mg | ORAL_CAPSULE | Freq: Two times a day (BID) | ORAL | Status: DC
  Filled 2022-04-29: qty 1

## 2022-04-29 MED ORDER — KETOROLAC TROMETHAMINE 60 MG/2ML IM SOLN
30.0000 mg | Freq: Once | INTRAMUSCULAR | Status: AC
  Administered 2022-04-29: 30 mg via INTRAMUSCULAR
  Filled 2022-04-29: qty 2

## 2022-04-29 MED ORDER — ACETAMINOPHEN 325 MG PO TABS
650.0000 mg | ORAL_TABLET | Freq: Once | ORAL | Status: AC | PRN
Start: 2022-04-29 — End: 2022-04-29
  Administered 2022-04-29: 650 mg via ORAL
  Filled 2022-04-29: qty 2

## 2022-04-29 MED ORDER — AMOXICILLIN-POT CLAVULANATE 875-125 MG PO TABS: 1.0000 | ORAL_TABLET | Freq: Once | ORAL | Status: DC

## 2022-04-29 MED ORDER — DEXAMETHASONE 4 MG PO TABS
10.0000 mg | ORAL_TABLET | Freq: Once | ORAL | Status: AC
  Administered 2022-04-29: 10 mg via ORAL
  Filled 2022-04-29: qty 3

## 2022-04-29 MED ORDER — LIDOCAINE VISCOUS HCL 2 % MT SOLN
15.0000 mL | Freq: Once | OROMUCOSAL | Status: AC
  Administered 2022-04-29: 15 mL via OROMUCOSAL
  Filled 2022-04-29: qty 15

## 2022-04-29 NOTE — ED Triage Notes (Addendum)
Patient arrives POV c/o sore throat, right ear pain, body aches, and fever for the past 5 days. Pt states headache started yesterday and that no medication has helped. Pt reports taking amoxicillin, tylenol, and motrin with no relief. Pt denies COVID exposure to her knowledge. Pt states tonsils "hurt so much" she is unable to swallow. Pt is febrile with 100.9 oral temp.

## 2022-04-30 NOTE — ED Provider Notes (Signed)
  [facility] Provider Note   CSN: [id number] Arrival date & time: 04/29/22  0346     History {Add pertinent medical, surgical, social history, OB history to HPI:1} Chief Complaint  Patient presents with   HA/sore throat/ear pain/fever    Alyssa Blackwell Alyssa Blackwell is a 36 y.o. female.  HPI     Home Medications Prior to Admission medications   Medication Sig Start Date End Date Taking? Authorizing Provider  acetaminophen (TYLENOL) 500 MG tablet Take 1 tablet (500 mg total) by mouth every 6 (six) hours. 04/29/22  Yes Amreen Raczkowski, Corene Cornea, MD  cefdinir (OMNICEF) 300 MG capsule Take 1 capsule (300 mg total) by mouth 2 (two) times daily for 7 days. 04/29/22 05/06/22 Yes Javonne Dorko, Corene Cornea, MD  ibuprofen (ADVIL) 400 MG tablet Take 1 tablet (400 mg total) by mouth 4 (four) times daily. 04/29/22  Yes Aayat Hajjar, Corene Cornea, MD  cetirizine (ZYRTEC) 10 MG tablet Take 1 tablet (10 mg total) by mouth daily. 03/29/22   Alyssa Donovan, PA-C      Allergies    Patient has no known allergies.    Review of Systems   Review of Systems  Physical Exam Updated Vital Signs BP 111/78 (BP Location: Left Arm)   Pulse 88   Temp 98.3 F (36.8 C) (Oral)   Resp 16   Ht 5\' 1"  (1.549 m)   Wt 85 kg   SpO2 97%   BMI 35.41 kg/m  Physical Exam  ED Results / Procedures / Treatments   Labs (all labs ordered are listed, but only abnormal results are displayed) Labs Reviewed  GROUP A STREP BY PCR  SARS CORONAVIRUS 2 BY RT PCR    EKG None  Radiology No results found.  Procedures Procedures  {Document cardiac monitor, telemetry assessment procedure when appropriate:1}  Medications Ordered in ED Medications  acetaminophen (TYLENOL) tablet 650 mg (650 mg Oral Given 04/29/22 0429)  ibuprofen (ADVIL) tablet 600 mg (600 mg Oral Given 04/29/22 0429)  ketorolac (TORADOL) injection 30 mg (30 mg Intramuscular Given 04/29/22 0742)  dexamethasone (DECADRON) tablet 10 mg (10 mg Oral  Given 04/29/22 0742)  lidocaine (XYLOCAINE) 2 % viscous mouth solution 15 mL (15 mLs Mouth/Throat Given 04/29/22 2952)    ED Course/ Medical Decision Making/ A&P                           Medical Decision Making Risk OTC drugs. Prescription drug management.   ***  {Document critical care time when appropriate:1} {Document review of labs and clinical decision tools ie heart score, Chads2Vasc2 etc:1}  {Document your independent review of radiology images, and any outside records:1} {Document your discussion with family members, caretakers, and with consultants:1} {Document social determinants of health affecting pt's care:1} {Document your decision making why or why not admission, treatments were needed:1} Final Clinical Impression(s) / ED Diagnoses Final diagnoses:  Odynophagia  Fever, unspecified    Rx / DC Orders ED Discharge Orders          Ordered    cefdinir (OMNICEF) 300 MG capsule  2 times daily        04/29/22 0659    acetaminophen (TYLENOL) 500 MG tablet  Every 6 hours        04/29/22 0659    ibuprofen (ADVIL) 400 MG tablet  4 times daily        04/29/22 0659

## 2022-05-02 DIAGNOSIS — Z113 Encounter for screening for infections with a predominantly sexual mode of transmission: Secondary | ICD-10-CM | POA: Diagnosis not present

## 2022-05-02 DIAGNOSIS — Z3200 Encounter for pregnancy test, result unknown: Secondary | ICD-10-CM | POA: Diagnosis not present

## 2022-05-02 DIAGNOSIS — Z3202 Encounter for pregnancy test, result negative: Secondary | ICD-10-CM | POA: Diagnosis not present

## 2022-05-02 DIAGNOSIS — Z3042 Encounter for surveillance of injectable contraceptive: Secondary | ICD-10-CM | POA: Diagnosis not present

## 2022-05-06 DIAGNOSIS — Z419 Encounter for procedure for purposes other than remedying health state, unspecified: Secondary | ICD-10-CM | POA: Diagnosis not present

## 2022-05-29 ENCOUNTER — Inpatient Hospital Stay: Payer: Medicaid Other | Admitting: Internal Medicine

## 2022-06-06 DIAGNOSIS — Z419 Encounter for procedure for purposes other than remedying health state, unspecified: Secondary | ICD-10-CM | POA: Diagnosis not present

## 2022-06-29 ENCOUNTER — Other Ambulatory Visit: Payer: Self-pay | Admitting: Physician Assistant

## 2022-06-29 DIAGNOSIS — J018 Other acute sinusitis: Secondary | ICD-10-CM

## 2022-07-02 NOTE — Telephone Encounter (Signed)
Requested Prescriptions  Pending Prescriptions Disp Refills   cetirizine (ZYRTEC) 10 MG tablet [Pharmacy Med Name: CETIRIZINE HCL 10 MG TABLET] 90 tablet 0    Sig: TAKE 1 TABLET BY MOUTH EVERY DAY     Ear, Nose, and Throat:  Antihistamines 2 Passed - 06/29/2022  4:54 PM      Passed - Cr in normal range and within 360 days    Creatinine, Ser  Date Value Ref Range Status  11/29/2021 0.78 0.57 - 1.00 mg/dL Final         Passed - Valid encounter within last 12 months    Recent Outpatient Visits           4 months ago Other acute sinusitis, recurrence not specified   [facility] Hoy Register, MD   7 months ago Vaginal odor   [facility] [place], [place], [place]   8 months ago Acute pharyngitis, unspecified etiology   [facility], Cari S, PA-C   10 months ago Bilateral carpal tunnel syndrome   [facility] Hoy Register, MD

## 2022-07-06 DIAGNOSIS — Z419 Encounter for procedure for purposes other than remedying health state, unspecified: Secondary | ICD-10-CM | POA: Diagnosis not present

## 2022-07-24 NOTE — Progress Notes (Deleted)
   Established Patient Office Visit  Subjective   Patient ID: Alyssa Blackwell, female    DOB: [birth year]  Age: 36 y.o. MRN: [id number]  No chief complaint on file.   36 y.o.F  PCP [place]   Pulm chk Flu    {History (Optional):23778}  ROS    Objective:     There were no vitals taken for this visit. {Vitals History (Optional):23777}  Physical Exam   No results found for any visits on 07/25/22.  {Labs (Optional):23779}  The ASCVD Risk score (Arnett DK, et al., 2019) failed to calculate for the following reasons:   The 2019 ASCVD risk score is only valid for ages 53 to 38    Assessment & Plan:   Problem List Items Addressed This Visit   None   No follow-ups on file.    Shan Levans, MD

## 2022-07-25 ENCOUNTER — Ambulatory Visit: Payer: Medicaid Other | Admitting: Critical Care Medicine

## 2022-08-06 DIAGNOSIS — Z419 Encounter for procedure for purposes other than remedying health state, unspecified: Secondary | ICD-10-CM | POA: Diagnosis not present

## 2022-08-29 ENCOUNTER — Ambulatory Visit: Payer: Medicaid Other | Attending: Family Medicine | Admitting: Family Medicine

## 2022-08-29 ENCOUNTER — Encounter: Payer: Self-pay | Admitting: Family Medicine

## 2022-08-29 VITALS — BP 118/79 | HR 91 | Temp 99.2°F | Ht 61.0 in | Wt 189.6 lb

## 2022-08-29 DIAGNOSIS — G44229 Chronic tension-type headache, not intractable: Secondary | ICD-10-CM

## 2022-08-29 DIAGNOSIS — L219 Seborrheic dermatitis, unspecified: Secondary | ICD-10-CM

## 2022-08-29 DIAGNOSIS — J320 Chronic maxillary sinusitis: Secondary | ICD-10-CM

## 2022-08-29 MED ORDER — AMOXICILLIN-POT CLAVULANATE 875-125 MG PO TABS: 1.0000 | ORAL_TABLET | Freq: Two times a day (BID) | ORAL | 0 refills | Status: DC

## 2022-08-29 MED ORDER — CETIRIZINE HCL 10 MG PO TABS: 10.0000 mg | ORAL_TABLET | Freq: Every day | ORAL | 1 refills | Status: DC

## 2022-08-29 MED ORDER — KETOCONAZOLE 2 % EX SHAM: 1.0000 | MEDICATED_SHAMPOO | CUTANEOUS | 1 refills | Status: DC

## 2022-08-29 MED ORDER — FLUTICASONE PROPIONATE 50 MCG/ACT NA SUSP: 2.0000 | Freq: Every day | NASAL | 1 refills | Status: DC

## 2022-08-29 MED ORDER — BUTALBITAL-APAP-CAFFEINE 50-325-40 MG PO TABS: 1.0000 | ORAL_TABLET | Freq: Four times a day (QID) | ORAL | 0 refills | Status: DC | PRN

## 2022-08-29 NOTE — Progress Notes (Signed)
Subjective:  Patient ID: Alyssa Blackwell, female    DOB: [birth year]  Age: 37 y.o. MRN: [id number]  CC: Headache   HPI Alyssa Blackwell is a 37 y.o. year old female wiho presents for an acute visit  Interval History: She presents with a Cough which has been present x3 weeks and only when she lies down. It is asscoaied with throat itching, is dry but she has no wheezing, dyspnea or chest pain.  Headaches are frontal occur daily and this is associated with seeing spots of different colors. She sometimes has photophobia. She recently stared wearing glasses and has headaches when she wears her glasses; she was diagnosed with astigmatism as well She has sometimes has associated dizziness with the headache but no nausea, vomiting, neck pain. Symptoms are not disabling.  She has undergone some endodontic procedures but continues to have purulent discharge from her right incisor requiring recurent antibiotics treatments. She also has persistent right maxillary sinus tenderness. Her Endodontist recommended she undergo imaging to identify possible underlying causes. She complains of itching in her scalp but has not noticed any flaking.  Past Medical History:  Diagnosis Date   Arrhythmia    occurs only in pregnancy, had evaluation in [place] with 5th pregnancy, normal evaluation per pt   Panic attacks     Past Surgical History:  Procedure Laterality Date   CARPAL TUNNEL RELEASE Right 01/15/2022   Procedure: RIGHT CARPAL TUNNEL RELEASE;  Surgeon: Sherilyn Cooter, MD;  Location: [facility];  Service: Orthopedics;  Laterality: Right;   CARPAL TUNNEL RELEASE Left 03/19/2022   Procedure: LEFT CARPAL TUNNEL RELEASE;  Surgeon: Sherilyn Cooter, MD;  Location: [facility];  Service: Orthopedics;  Laterality: Left;    Family History  Problem Relation Age of Onset   Heart attack Father    Heart disease Father    Hypertension Paternal  Grandmother    Healthy Mother     Social History   Socioeconomic History   Marital status: Married    Spouse name: Not on file   Number of children: Not on file   Years of education: Not on file   Highest education level: Not on file  Occupational History   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  Tobacco Use   Smoking status: Never   Smokeless tobacco: Never  Vaping Use   Vaping Use: Never used  Substance and Sexual Activity   Alcohol use: Never   Drug use: Never   Sexual activity: Yes    Birth control/protection: None  Other Topics Concern   Not on file  Social History Narrative   Not on file   Social Determinants of Health   Financial Resource Strain: Not on file  Food Insecurity: Food Insecurity Present (04/19/2021)   Hunger Vital Sign    Worried About Running Out of Food in the Last Year: Sometimes true    Ran Out of Food in the Last Year: Sometimes true  Transportation Needs: No Transportation Needs (04/19/2021)   PRAPARE - Hydrologist (Medical): No    Lack of Transportation (Non-Medical): No  Physical Activity: Not on file  Stress: Not on file  Social Connections: Not on file    No Known Allergies  Outpatient Medications Prior to Visit  Medication Sig Dispense Refill   cetirizine (ZYRTEC) 10 MG tablet TAKE 1 TABLET BY MOUTH EVERY DAY 90 tablet 0   acetaminophen (TYLENOL) 500 MG tablet Take 1 tablet (500 mg total) by mouth  every 6 (six) hours. (Patient not taking: Reported on 08/29/2022) 30 tablet 0   ibuprofen (ADVIL) 400 MG tablet Take 1 tablet (400 mg total) by mouth 4 (four) times daily. (Patient not taking: Reported on 08/29/2022) 30 tablet 0   No facility-administered medications prior to visit.     ROS Review of Systems  Constitutional:  Negative for activity change and appetite change.  HENT:  Positive for dental problem. Negative for sinus pressure and sore throat.   Respiratory:  Positive for cough. Negative for chest tightness,  shortness of breath and wheezing.   Cardiovascular:  Negative for chest pain and palpitations.  Gastrointestinal:  Negative for abdominal distention, abdominal pain and constipation.  Genitourinary: Negative.   Musculoskeletal: Negative.   Neurological:  Positive for headaches.  Psychiatric/Behavioral:  Negative for behavioral problems and dysphoric mood.     Objective:  BP 118/79   Pulse 91   Temp 99.2 F (37.3 C) (Oral)   Ht 5\' 1"  (1.549 m)   Wt 189 lb 9.6 oz (86 kg)   SpO2 99%   BMI 35.82 kg/m      08/29/2022   11:37 AM 04/29/2022    6:07 AM 04/29/2022    4:20 AM  BP/Weight  Systolic BP 166 063   Diastolic BP 79 78   Wt. (Lbs) 189.6  187.39  BMI 35.82 kg/m2  35.41 kg/m2      Physical Exam Constitutional:      Appearance: She is well-developed.  HENT:     Head:     Comments: Right maxillary sinus tenderness Cardiovascular:     Rate and Rhythm: Normal rate.     Heart sounds: Normal heart sounds. No murmur heard. Pulmonary:     Effort: Pulmonary effort is normal.     Breath sounds: Normal breath sounds. No wheezing or rales.  Chest:     Chest wall: No tenderness.  Abdominal:     General: Bowel sounds are normal. There is no distension.     Palpations: Abdomen is soft. There is no mass.     Tenderness: There is no abdominal tenderness.  Musculoskeletal:        General: Normal range of motion.     Right lower leg: No edema.     Left lower leg: No edema.  Neurological:     Mental Status: She is alert and oriented to person, place, and time.  Psychiatric:        Mood and Affect: Mood normal.        Latest Ref Rng & Units 12/08/2021    4:40 PM 11/29/2021   10:28 AM 08/14/2021    9:44 AM  CMP  Glucose 70 - 99 mg/dL  92  93   BUN 6 - 20 mg/dL  11  10   Creatinine 0.57 - 1.00 mg/dL  0.78  0.60   Sodium 134 - 144 mmol/L  137  139   Potassium 3.5 - 5.2 mmol/L  3.9  4.6   Chloride 96 - 106 mmol/L  99  104   CO2 20 - 29 mmol/L  23  22   Calcium 8.7 - 10.2 mg/dL   9.4  9.4   Total Protein 6.0 - 8.5 g/dL 7.8  7.6    Total Bilirubin 0.0 - 1.2 mg/dL 0.5  0.7    Alkaline Phos 44 - 121 IU/L 72  97    AST 0 - 40 IU/L 17  202    ALT 0 - 32 IU/L 34  293      Lipid Panel     Component Value Date/Time   CHOL 152 08/14/2021 0944   TRIG 87 08/14/2021 0944   HDL 43 08/14/2021 0944   LDLCALC 92 08/14/2021 0944    CBC    Component Value Date/Time   WBC 4.7 11/29/2021 1028   WBC 12.4 (H) 01/19/2021 0128   RBC 4.69 11/29/2021 1028   RBC 4.09 01/19/2021 0128   HGB 15.2 11/29/2021 1028   HCT 43.5 11/29/2021 1028   PLT 199 11/29/2021 1028   MCV 93 11/29/2021 1028   MCH 32.4 11/29/2021 1028   MCH 31.8 01/19/2021 0128   MCHC 34.9 11/29/2021 1028   MCHC 33.4 01/19/2021 0128   RDW 12.7 11/29/2021 1028   LYMPHSABS 1.6 11/29/2021 1028   EOSABS 0.1 11/29/2021 1028   BASOSABS 0.0 11/29/2021 1028    Lab Results  Component Value Date   HGBA1C 5.5 08/14/2021    Assessment & Plan:  1. Chronic maxillary sinusitis Could explain her cough Will place her on Zyrtec Due to recurrent purulent discharge from tooth I will order CT maxillofacial with contrast in order Augmentin - cetirizine (ZYRTEC) 10 MG tablet; Take 1 tablet (10 mg total) by mouth daily.  Dispense: 90 tablet; Refill: 1 - Basic Metabolic Panel - amoxicillin-clavulanate (AUGMENTIN) 875-125 MG tablet; Take 1 tablet by mouth 2 (two) times daily.  Dispense: 20 tablet; Refill: 0 - CT Maxillofacial W/Cm; Future  2. Chronic tension-type headache, not intractable Possible combination of sinus headache and tension headache - butalbital-acetaminophen-caffeine (FIORICET) 50-325-40 MG tablet; Take 1 tablet by mouth every 6 (six) hours as needed for headache.  Dispense: 30 tablet; Refill: 0 - fluticasone (FLONASE) 50 MCG/ACT nasal spray; Place 2 sprays into both nostrils daily.  Dispense: 16 g; Refill: 1  3. Seborrheic dermatitis - ketoconazole (NIZORAL) 2 % shampoo; Apply 1 Application topically 2  (two) times a week.  Dispense: 120 mL; Refill: 1   Meds ordered this encounter  Medications   cetirizine (ZYRTEC) 10 MG tablet    Sig: Take 1 tablet (10 mg total) by mouth daily.    Dispense:  90 tablet    Refill:  1   butalbital-acetaminophen-caffeine (FIORICET) 50-325-40 MG tablet    Sig: Take 1 tablet by mouth every 6 (six) hours as needed for headache.    Dispense:  30 tablet    Refill:  0   fluticasone (FLONASE) 50 MCG/ACT nasal spray    Sig: Place 2 sprays into both nostrils daily.    Dispense:  16 g    Refill:  1   ketoconazole (NIZORAL) 2 % shampoo    Sig: Apply 1 Application topically 2 (two) times a week.    Dispense:  120 mL    Refill:  1   amoxicillin-clavulanate (AUGMENTIN) 875-125 MG tablet    Sig: Take 1 tablet by mouth 2 (two) times daily.    Dispense:  20 tablet    Refill:  0    Follow-up: Return if symptoms worsen or fail to improve.       Hoy Register, MD, FAAFP. [facility] [place], [place] [phone]   08/29/2022, 12:58 PM

## 2022-08-29 NOTE — Progress Notes (Signed)
Coughing at night Frequent headaches Dental infection.

## 2022-08-29 NOTE — Patient Instructions (Signed)
Dermatitis seborreica en los adultos Seborrheic Dermatitis, Adult La dermatitis seborreica es una enfermedad cutnea que causa manchas rojas y escamosas. Suele aparecer en el cuero cabelludo, donde puede denominarse "caspa". Las manchas tambin pueden aparecer en otras partes del cuerpo. Las [employer] en la piel tienden a Arts administrator donde hay muchas glndulas sebceas. Las zonas del cuerpo que pueden verse afectadas incluyen las siguientes: El cuero cabelludo. La cara, las cejas y los odos. La zona alrededor [employer]. Los pliegues de la piel. Estos [employer], la ingle y las nalgas. El pecho. La afeccin suele ser de larga duracin (crnica). Puede aparecer y desaparecer sin motivo conocido. Puede activarse por un factor desencadenante, por ejemplo: El clima fro. La exposicin al sol. Estrs. El consumo de alcohol. Cules son las causas? Se desconoce la causa de esta afeccin. Puede estar relacionada con un exceso de hongos en la piel o cambios en el funcionamiento del sistema que combate las enfermedades del cuerpo (sistema inmunitario). Qu incrementa el riesgo? Hay ms probabilidad de que tenga esta afeccin si: Tiene un sistema inmunitario dbil. Tiene 50 aos o ms. Tiene otras afecciones, como: Virus de inmunodeficiencia humana (VIH) o sndrome de inmunodeficiencia adquirida (sida). Enfermedad de Parkinson. Trastornos del [place] de nimo, como depresin. Problemas hepticos. Obesidad. Cules son los signos o sntomas? Los sntomas de esta afeccin incluyen: Escamas gruesas en el cuero cabelludo. Enrojecimiento en el rostro o en las [place]. Piel escamosa. Las Le[employer] ser de color blanco o [place]. Piel que parece ser grasa o seca pero que no mejora con cremas hidratantes. Picazn o [employer] zonas afectadas. Cmo se diagnostica? Esta afeccin se diagnostica mediante una revisin de los antecedentes mdicos y un examen fsico. Se pueden hacer estudios  de una muestra de piel (biopsia de piel). Tal vez haya que consultar a un especialista en piel (dermatlogo). Cmo se trata? No hay cura para esta afeccin, pero el tratamiento puede ayudar a [employer] sntomas. Puede recibir tratamiento para [employer], reducir el riesgo de infecciones cutneas y reducir la hinchazn o la picazn. El tratamiento puede incluir: Champs con medicamentos, cremas humectantes o ungentos. Cremas para eliminar los hongos en la piel. Cremas para reducir la hinchazn y la irritacin (corticoesteroides). Siga estas instrucciones en su casa: Cuidado de la piel Aplquese champ con medicamentos, cremas o ungentos como se lo haya indicado el mdico. No use productos para la piel que contengan alcohol. Tome duchas o baos de inmersin de agua tibia. Evite el agua muy caliente. Cuando est al Alexis Goodell, use sombrero y vestimenta que bloquee la luz UV. Instrucciones generales Aplquese los medicamentos de venta libre y los recetados solamente como se lo haya indicado el mdico. Sepa cules son los desencadenantes que causan los sntomas para poder evitarlos. Recurra a tcnicas que reduzcan el estrs, como meditacin o yoga. No beba alcohol si el mdico se lo prohbe. Concurra a [place]. El mdico le examinar la piel para asegurarse de que los tratamientos sean eficaces. Dnde obtener ms informacin Public affairs consultant of Dermatology ([employer]ia Estadoun[employer] de Dermatologa): [email or web address].ca Comunquese con un mdico si: Los sntomas no mejoran con Dispensing optician. Sus sntomas empeoran. Aparecen nuevos sntomas. Solicite ayuda de inmediato si: Su afeccin empeora rpidamente, incluso con tratamiento. Esta informacin no tiene Marine scientist el consejo del mdico. Asegrese de hacerle al mdico cualquier pregunta que tenga. Document Revised: 01/12/2022 Document Reviewed: 01/12/2022 Elsevier Patient Education  [place].

## 2022-08-30 LAB — BASIC METABOLIC PANEL
BUN/Creatinine Ratio: 19 (ref 9–23)
BUN: 12 mg/dL (ref 6–20)
CO2: 22 mmol/L (ref 20–29)
Calcium: 9.8 mg/dL (ref 8.7–10.2)
Chloride: 105 mmol/L (ref 96–106)
Creatinine, Ser: 0.63 mg/dL (ref 0.57–1.00)
Glucose: 87 mg/dL (ref 70–99)
Potassium: 4.3 mmol/L (ref 3.5–5.2)
Sodium: 143 mmol/L (ref 134–144)
eGFR: 119 mL/min/{1.73_m2} (ref >59)

## 2022-09-06 DIAGNOSIS — Z419 Encounter for procedure for purposes other than remedying health state, unspecified: Secondary | ICD-10-CM | POA: Diagnosis not present

## 2022-10-05 DIAGNOSIS — Z419 Encounter for procedure for purposes other than remedying health state, unspecified: Secondary | ICD-10-CM | POA: Diagnosis not present

## 2022-11-05 DIAGNOSIS — Z419 Encounter for procedure for purposes other than remedying health state, unspecified: Secondary | ICD-10-CM | POA: Diagnosis not present

## 2022-11-07 DIAGNOSIS — Z114 Encounter for screening for human immunodeficiency virus [HIV]: Secondary | ICD-10-CM | POA: Diagnosis not present

## 2022-11-07 DIAGNOSIS — Z309 Encounter for contraceptive management, unspecified: Secondary | ICD-10-CM | POA: Diagnosis not present

## 2022-11-07 DIAGNOSIS — Z3042 Encounter for surveillance of injectable contraceptive: Secondary | ICD-10-CM | POA: Diagnosis not present

## 2022-11-07 DIAGNOSIS — Z113 Encounter for screening for infections with a predominantly sexual mode of transmission: Secondary | ICD-10-CM | POA: Diagnosis not present

## 2022-11-07 DIAGNOSIS — Z3202 Encounter for pregnancy test, result negative: Secondary | ICD-10-CM | POA: Diagnosis not present

## 2022-11-07 DIAGNOSIS — Z01419 Encounter for gynecological examination (general) (routine) without abnormal findings: Secondary | ICD-10-CM | POA: Diagnosis not present

## 2022-11-14 ENCOUNTER — Encounter: Payer: Self-pay | Admitting: Physician Assistant

## 2022-11-14 ENCOUNTER — Ambulatory Visit: Payer: Medicaid Other | Attending: Physician Assistant | Admitting: Physician Assistant

## 2022-11-14 VITALS — BP 117/83 | HR 74 | Wt 196.2 lb

## 2022-11-14 DIAGNOSIS — R196 Halitosis: Secondary | ICD-10-CM

## 2022-11-14 DIAGNOSIS — M79641 Pain in right hand: Secondary | ICD-10-CM | POA: Diagnosis not present

## 2022-11-14 DIAGNOSIS — J320 Chronic maxillary sinusitis: Secondary | ICD-10-CM

## 2022-11-14 DIAGNOSIS — M79642 Pain in left hand: Secondary | ICD-10-CM | POA: Diagnosis not present

## 2022-11-14 DIAGNOSIS — J340 Abscess, furuncle and carbuncle of nose: Secondary | ICD-10-CM

## 2022-11-14 MED ORDER — MELOXICAM 15 MG PO TABS: 15.0000 mg | ORAL_TABLET | Freq: Every day | ORAL | 2 refills | Status: DC

## 2022-11-14 NOTE — Progress Notes (Signed)
Patient ID: Alyssa Blackwell, female   DOB: Dec 22, 1985, 37 y.o.   MRN: [id number]   Alyssa Blackwell, is a 37 y.o. female  [id number]:[id number]  [id number]  DOB - 1986/01/21  Chief Complaint  Patient presents with   Hand Pain   Dental Pain       Subjective:   Alyssa Blackwell is a 37 y.o. female here today for continued problems with what she originally thought was a dental issue but she has been to 2 dentists and they have said it is not from her tooth but maybe from her sinuses.  This has been going on for about 2 years without resolution after many rounds of antibiotics.  She has been taking amoxicillin from [place] the last 5 days.  No fever.  She is also c/o bad breath.    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  The numbness is gone but her hands continue to cause her pain.  She cleans for a living  No problems updated.  ALLERGIES: No Known Allergies  PAST MEDICAL HISTORY: Past Medical History:  Diagnosis Date   Arrhythmia    occurs only in pregnancy, had evaluation in [place] with 5th pregnancy, normal evaluation per pt   Panic attacks     MEDICATIONS AT HOME: Prior to Admission medications   Medication Sig Start Date End Date Taking? Authorizing Provider  amoxicillin-clavulanate (AUGMENTIN) 875-125 MG tablet Take 1 tablet by mouth 2 (two) times daily. 08/29/22  Yes Hoy Register, MD  butalbital-acetaminophen-caffeine (FIORICET) 50-325-40 MG tablet Take 1 tablet by mouth every 6 (six) hours as needed for headache. 08/29/22 08/29/23 Yes Hoy Register, MD  cetirizine (ZYRTEC) 10 MG tablet Take 1 tablet (10 mg total) by mouth daily. 08/29/22  Yes Newlin, Odette Horns, MD  fluticasone (FLONASE) 50 MCG/ACT nasal spray Place 2 sprays into both nostrils daily. 08/29/22  Yes Hoy Register, MD  meloxicam (MOBIC) 15 MG tablet Take 1 tablet (15 mg total) by mouth daily. Prn pain 11/14/22  Yes Anders Simmonds, PA-C  acetaminophen (TYLENOL) 500 MG tablet  Take 1 tablet (500 mg total) by mouth every 6 (six) hours. Patient not taking: Reported on 08/29/2022 04/29/22   Mesner, Barbara Cower, MD  ibuprofen (ADVIL) 400 MG tablet Take 1 tablet (400 mg total) by mouth 4 (four) times daily. Patient not taking: Reported on 08/29/2022 04/29/22   Mesner, Barbara Cower, MD  ketoconazole (NIZORAL) 2 % shampoo Apply 1 Application topically 2 (two) times a week. Patient not taking: Reported on 11/14/2022 08/30/22   Hoy Register, MD    ROS: Neg HEENT Neg resp Neg cardiac Neg GI Neg GU Neg psych Neg neuro  Objective:   Vitals:   11/14/22 1043  BP: 117/83  Pulse: 74  SpO2: 99%  Weight: 196 lb 3.2 oz (89 kg)   Exam General appearance : Awake, alert, not in any distress. Speech Clear. Not toxic looking HEENT: Atraumatic and Normocephalic.  Some odor.  Mallampati 2 Neck: Supple, no JVD. No cervical lymphadenopathy.  Chest: Good air entry bilaterally, CTAB.  No rales/rhonchi/wheezing CVS: S1 S2 regular, no murmurs.  B hands with full S&ROM Extremities: B/L Lower Ext shows no edema, both legs are warm to touch Neurology: Awake alert, and oriented X 3, CN II-XII intact, Non focal Skin: No Rash  Data Review Lab Results  Component Value Date   HGBA1C 5.5 08/14/2021   HGBA1C 5.1 08/12/2020    Assessment & Plan   1. Pain in both  hands - Ambulatory referral to Orthopedic Surgery - meloxicam (MOBIC) 15 MG tablet; Take 1 tablet (15 mg total) by mouth daily. Prn pain  Dispense: 30 tablet; Refill: 2  2. Abscess, furuncle and carbuncle of nose - CT Maxillofacial WO CM; Future - Ambulatory referral to ENT  3. Chronic maxillary sinusitis - Ambulatory referral to ENT  4. Bad breath - Ambulatory referral to ENT  AMN "Julieta" interpreters used and additional time performing visit was required.     Return if symptoms worsen or fail to improve.  The patient was given clear instructions to go to ER or return to medical center if symptoms don't improve, worsen  or new problems develop. The patient verbalized understanding. The patient was told to call to get lab results if they haven't heard anything in the next week.      Georgian Co, PA-C [facility] [place], [place] [phone]   11/14/2022, 11:09 AM

## 2022-12-04 DIAGNOSIS — M79641 Pain in right hand: Secondary | ICD-10-CM | POA: Diagnosis not present

## 2022-12-04 DIAGNOSIS — M79642 Pain in left hand: Secondary | ICD-10-CM | POA: Diagnosis not present

## 2022-12-05 DIAGNOSIS — Z419 Encounter for procedure for purposes other than remedying health state, unspecified: Secondary | ICD-10-CM | POA: Diagnosis not present

## 2023-01-05 DIAGNOSIS — Z419 Encounter for procedure for purposes other than remedying health state, unspecified: Secondary | ICD-10-CM | POA: Diagnosis not present

## 2023-01-18 DIAGNOSIS — Z114 Encounter for screening for human immunodeficiency virus [HIV]: Secondary | ICD-10-CM | POA: Diagnosis not present

## 2023-01-18 DIAGNOSIS — N764 Abscess of vulva: Secondary | ICD-10-CM | POA: Diagnosis not present

## 2023-01-18 DIAGNOSIS — Z113 Encounter for screening for infections with a predominantly sexual mode of transmission: Secondary | ICD-10-CM | POA: Diagnosis not present

## 2023-01-18 DIAGNOSIS — Z3042 Encounter for surveillance of injectable contraceptive: Secondary | ICD-10-CM | POA: Diagnosis not present

## 2023-01-24 ENCOUNTER — Ambulatory Visit: Payer: Medicaid Other | Admitting: Family Medicine

## 2023-02-04 DIAGNOSIS — Z419 Encounter for procedure for purposes other than remedying health state, unspecified: Secondary | ICD-10-CM | POA: Diagnosis not present

## 2023-02-14 DIAGNOSIS — Z3042 Encounter for surveillance of injectable contraceptive: Secondary | ICD-10-CM | POA: Diagnosis not present

## 2023-02-14 DIAGNOSIS — Z3009 Encounter for other general counseling and advice on contraception: Secondary | ICD-10-CM | POA: Diagnosis not present

## 2023-03-07 DIAGNOSIS — Z419 Encounter for procedure for purposes other than remedying health state, unspecified: Secondary | ICD-10-CM | POA: Diagnosis not present

## 2023-03-25 DIAGNOSIS — Z113 Encounter for screening for infections with a predominantly sexual mode of transmission: Secondary | ICD-10-CM | POA: Diagnosis not present

## 2023-03-25 DIAGNOSIS — N644 Mastodynia: Secondary | ICD-10-CM | POA: Diagnosis not present

## 2023-04-01 ENCOUNTER — Encounter: Payer: Self-pay | Admitting: Family Medicine

## 2023-04-01 ENCOUNTER — Ambulatory Visit: Payer: Medicaid Other | Attending: Family Medicine | Admitting: Family Medicine

## 2023-04-01 VITALS — BP 117/80 | HR 106 | Ht 61.0 in | Wt 187.6 lb

## 2023-04-01 DIAGNOSIS — N644 Mastodynia: Secondary | ICD-10-CM | POA: Diagnosis not present

## 2023-04-01 DIAGNOSIS — R197 Diarrhea, unspecified: Secondary | ICD-10-CM | POA: Diagnosis not present

## 2023-04-01 DIAGNOSIS — R5383 Other fatigue: Secondary | ICD-10-CM

## 2023-04-01 DIAGNOSIS — L659 Nonscarring hair loss, unspecified: Secondary | ICD-10-CM

## 2023-04-01 DIAGNOSIS — L219 Seborrheic dermatitis, unspecified: Secondary | ICD-10-CM

## 2023-04-01 MED ORDER — DICYCLOMINE HCL 10 MG PO CAPS
10.0000 mg | ORAL_CAPSULE | Freq: Three times a day (TID) | ORAL | 1 refills | Status: AC
Start: 2023-04-01 — End: ?

## 2023-04-01 MED ORDER — KETOCONAZOLE 2 % EX SHAM: 1.0000 | MEDICATED_SHAMPOO | CUTANEOUS | 3 refills | Status: DC

## 2023-04-01 NOTE — Patient Instructions (Signed)
Dolor a Insurance claims handler de las mamas Breast Tenderness El dolor a la palpacin de las mamas es un problema frecuente en las mujeres de todas las edades, pero tambin puede ocurrir en los hombres. [place] las causas posibles del dolor a la palpacin de las [place], incluidos los cambios hormonales, infecciones, el uso de ciertos medicamentos y el consumo de cafena. En las mujeres, el dolor generalmente es intermitente y se relaciona con el ciclo menstrual, pero puede ser constante. El dolor a la palpacin de las mamas puede oscilar desde molestias leves hasta un dolor intenso. Puede someterse a pruebas como una mamografa o una ecografa, para Airline pilot inusuales. Por lo general, el dolor a la palpacin de las mamas no significa que usted Haematologist de mama. Siga estas instrucciones en su casa: Control del dolor y de las molestias  Si se lo indican, aplique hielo sobre la zona dolorida. Para hacer esto: Ponga el hielo en una bolsa plstica. Coloque una toalla entre la piel y Copy. Aplique el hielo durante 20 minutos, 2 a 3 veces por da. Si la piel se le pone de color rojo brillante, retire el hielo de inmediato para evitar daos en la piel. El riesgo de dao en la piel es mayor si no puede sentir dolor, calor o fro. Use un sostn de soporte o sujetador para el pecho: Durante la actividad fsica. Mientras duerme, si tiene las [employer]. Medicamentos Use los medicamentos de venta libre y los recetados solamente como se lo haya indicado el mdico. Si el dolor es causado por una infeccin, posiblemente le receten un antibitico. Si le recetaron antibitico, tmelo como se lo haya indicado el mdico. No deje de usar el antibitico aunque comience a Actor. Comida y bebida Disminuya la cantidad de cafena de su dieta. En cambio, beba ms agua y elija bebidas sin cafena. El mdico puede recomendarle disminuir el consumo de grasa en su dieta. Puede hacer lo  siguiente: Limite el consumo de alimentos fritos. A la hora de cocinarlos, opte por hornearlos, hervirlos, grillarlos y asarlos a Patent attorney. Instrucciones generales  Lleve un registro de [address] y las horas cuando tiene mayor sensibilidad en las [place]. Pregntele al mdico cmo debe hacerse los exmenes de mamas en su casa. Esto la ayudar a notar si tiene algn crecimiento o bulto fuera de lo normal. Concurra a todas las visitas de seguimiento. Comunquese con un mdico si: Cualquier zona de la mama est dura, enrojecida y caliente al tacto. Puede ser un signo de infeccin. Es mujer y tiene un bulto nuevo o doloroso en la mama que no desaparece despus de la finalizacin del perodo menstrual. Si no est en etapa de amamantamiento y [employer] lquido de los pezones, especialmente sangre o pus. Tiene fiebre. El dolor no mejora o [place]. El Engineer, manufacturing systems impide realizar las actividades cotidianas. Resumen El dolor a la palpacin de las mamas puede oscilar desde molestias leves hasta un dolor intenso. [place] las causas posibles del dolor a la palpacin de las [place], incluidos los cambios hormonales, infecciones, el uso de ciertos medicamentos y el consumo de cafena. Puede tratarse con hielo, el uso un sostn de soporte o sujetador para el [place], y [employer]. Haga cambios en la dieta como se lo haya indicado el mdico. Esta informacin no tiene Theme park manager el consejo del mdico. Asegrese de hacerle al mdico cualquier pregunta que tenga. Document Revised: 11/15/2021 Document Reviewed: 11/15/2021 Elsevier Patient Education  2024 Elsevier  Inc.

## 2023-04-01 NOTE — Progress Notes (Signed)
Subjective:  Patient ID: Alyssa Blackwell, female    DOB: 1986/06/16  Age: 37 y.o. MRN: [id number]  CC: Breast Pain (Right breast pain x7 days/Hair loss/Diarrhea for 2 weeks)   HPI Alyssa Blackwell is a 37 y.o. year old female with no significant past medical history who presents today for an acute visit.  Interval History: Discussed the use of AI scribe software for clinical note transcription with the patient, who gave verbal consent to proceed.   The patient, with a history of Depo-Provera use, presents with constant, mild right breast pain rated as a 6/10. The pain is present regardless of sleeping position and is not associated with any palpable lumps or bumps. She has a family history of breast cancer in female cousins.  She also reports significant hair loss, which she describes as 'big ones' when brushing her hair. This has been associated with a persistent scalp itch, which she has been managing with vinegar. She has been using an antifungal shampoo which I prescribed at her visit 7 months ago, which helped while she was using it, but the container was small and the issue has been ongoing for 2-3 years.  She also reports painful lymph nodes in the neck, which she has never had before. She has been experiencing constant diarrhea for over three weeks, which occurs up to four times a day and often immediately after eating. She has tried over-the-counter remedies such as Pepto Bismol without relief. She denies abdominal cramping, nausea, and vomiting.  She also reports significant fatigue, to the point where she feels like she has worked out. Her husband has noted that she seems to be in pain even while sleeping.        Past Medical History:  Diagnosis Date   Arrhythmia    occurs only in pregnancy, had evaluation in [place] with 5th pregnancy, normal evaluation per pt   Panic attacks     Past Surgical History:  Procedure Laterality Date   CARPAL TUNNEL  RELEASE Right 01/15/2022   Procedure: RIGHT CARPAL TUNNEL RELEASE;  Surgeon: Marlyne Beards, MD;  Location: [facility];  Service: Orthopedics;  Laterality: Right;   CARPAL TUNNEL RELEASE Left 03/19/2022   Procedure: LEFT CARPAL TUNNEL RELEASE;  Surgeon: Marlyne Beards, MD;  Location: [facility];  Service: Orthopedics;  Laterality: Left;    Family History  Problem Relation Age of Onset   Heart attack Father    Heart disease Father    Hypertension Paternal Grandmother    Healthy Mother     Social History   Socioeconomic History   Marital status: Married    Spouse name: Not on file   Number of children: Not on file   Years of education: Not on file   Highest education level: Not on file  Occupational History   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  Tobacco Use   Smoking status: Never   Smokeless tobacco: Never  Vaping Use   Vaping status: Never Used  Substance and Sexual Activity   Alcohol use: Never   Drug use: Never   Sexual activity: Yes    Birth control/protection: None  Other Topics Concern   Not on file  Social History Narrative   Not on file   Social Determinants of Health   Financial Resource Strain: Not on file  Food Insecurity: Food Insecurity Present (04/19/2021)   Hunger Vital Sign    Worried About Running Out of Food in the Last Year: Sometimes true  Ran Out of Food in the Last Year: Sometimes true  Transportation Needs: No Transportation Needs (04/19/2021)   PRAPARE - Administrator, Civil Service (Medical): No    Lack of Transportation (Non-Medical): No  Physical Activity: Not on file  Stress: Not on file  Social Connections: Not on file    No Known Allergies  Outpatient Medications Prior to Visit  Medication Sig Dispense Refill   butalbital-acetaminophen-caffeine (FIORICET) 50-325-40 MG tablet Take 1 tablet by mouth every 6 (six) hours as needed for headache. 30 tablet 0   meloxicam (MOBIC) 15 MG tablet Take 1  tablet (15 mg total) by mouth daily. Prn pain 30 tablet 2   acetaminophen (TYLENOL) 500 MG tablet Take 1 tablet (500 mg total) by mouth every 6 (six) hours. (Patient not taking: Reported on 08/29/2022) 30 tablet 0   amoxicillin-clavulanate (AUGMENTIN) 875-125 MG tablet Take 1 tablet by mouth 2 (two) times daily. (Patient not taking: Reported on 04/01/2023) 20 tablet 0   cetirizine (ZYRTEC) 10 MG tablet Take 1 tablet (10 mg total) by mouth daily. (Patient not taking: Reported on 04/01/2023) 90 tablet 1   fluticasone (FLONASE) 50 MCG/ACT nasal spray Place 2 sprays into both nostrils daily. (Patient not taking: Reported on 04/01/2023) 16 g 1   ibuprofen (ADVIL) 400 MG tablet Take 1 tablet (400 mg total) by mouth 4 (four) times daily. (Patient not taking: Reported on 08/29/2022) 30 tablet 0   ketoconazole (NIZORAL) 2 % shampoo Apply 1 Application topically 2 (two) times a week. (Patient not taking: Reported on 11/14/2022) 120 mL 1   No facility-administered medications prior to visit.     ROS Review of Systems  Constitutional:  Negative for activity change and appetite change.  HENT:  Negative for sinus pressure and sore throat.   Respiratory:  Negative for chest tightness, shortness of breath and wheezing.   Cardiovascular:  Negative for chest pain and palpitations.  Gastrointestinal:  Positive for diarrhea. Negative for abdominal distention, abdominal pain and constipation.  Genitourinary: Negative.   Musculoskeletal: Negative.   Psychiatric/Behavioral:  Negative for behavioral problems and dysphoric mood.     Objective:  BP 117/80   Pulse (!) 106   Ht 5\' 1"  (1.549 m)   Wt 187 lb 9.6 oz (85.1 kg)   BMI 35.45 kg/m      04/01/2023    2:33 PM 11/14/2022   10:43 AM 08/29/2022   11:37 AM  BP/Weight  Systolic BP 117 117 118  Diastolic BP 80 83 79  Wt. (Lbs) 187.6 196.2 189.6  BMI 35.45 kg/m2 37.07 kg/m2 35.82 kg/m2      Physical Exam Constitutional:      Appearance: She is  well-developed.  Cardiovascular:     Rate and Rhythm: Normal rate.     Heart sounds: Normal heart sounds. No murmur heard. Pulmonary:     Effort: Pulmonary effort is normal.     Breath sounds: Normal breath sounds. No wheezing or rales.  Chest:     Chest wall: No tenderness.  Breasts:    Right: No inverted nipple, mass, nipple discharge or tenderness.     Left: No inverted nipple, mass, nipple discharge or tenderness.  Abdominal:     General: Bowel sounds are normal. There is no distension.     Palpations: Abdomen is soft. There is no mass.     Tenderness: There is no abdominal tenderness.  Musculoskeletal:        General: Normal range of motion.  Right lower leg: No edema.     Left lower leg: No edema.  Lymphadenopathy:     Cervical: Cervical adenopathy (R anterior cervical) present.  Neurological:     Mental Status: She is alert and oriented to person, place, and time.  Psychiatric:        Mood and Affect: Mood normal.        Latest Ref Rng & Units 08/29/2022   12:15 PM 12/08/2021    4:40 PM 11/29/2021   10:28 AM  CMP  Glucose 70 - 99 mg/dL 87   92   BUN 6 - 20 mg/dL 12   11   Creatinine 6.07 - 1.00 mg/dL 3.71   0.62   Sodium 694 - 144 mmol/L 143   137   Potassium 3.5 - 5.2 mmol/L 4.3   3.9   Chloride 96 - 106 mmol/L 105   99   CO2 20 - 29 mmol/L 22   23   Calcium 8.7 - 10.2 mg/dL 9.8   9.4   Total Protein 6.0 - 8.5 g/dL  7.8  7.6   Total Bilirubin 0.0 - 1.2 mg/dL  0.5  0.7   Alkaline Phos 44 - 121 IU/L  72  97   AST 0 - 40 IU/L  17  202   ALT 0 - 32 IU/L  34  293     Lipid Panel     Component Value Date/Time   CHOL 152 08/14/2021 0944   TRIG 87 08/14/2021 0944   HDL 43 08/14/2021 0944   LDLCALC 92 08/14/2021 0944    CBC    Component Value Date/Time   WBC 4.7 11/29/2021 1028   WBC 12.4 (H) 01/19/2021 0128   RBC 4.69 11/29/2021 1028   RBC 4.09 01/19/2021 0128   HGB 15.2 11/29/2021 1028   HCT 43.5 11/29/2021 1028   PLT 199 11/29/2021 1028   MCV 93  11/29/2021 1028   MCH 32.4 11/29/2021 1028   MCH 31.8 01/19/2021 0128   MCHC 34.9 11/29/2021 1028   MCHC 33.4 01/19/2021 0128   RDW 12.7 11/29/2021 1028   LYMPHSABS 1.6 11/29/2021 1028   EOSABS 0.1 11/29/2021 1028   BASOSABS 0.0 11/29/2021 1028    Lab Results  Component Value Date   HGBA1C 5.5 08/14/2021    No results found for: "TSH"  Assessment & Plan:      Breast Pain Mild, constant pain for 7 days. No palpable lumps. Family history of breast cancer in female cousins. -Order breast ultrasound to rule out any abnormalities.  Hair Loss Significant hair loss reported. History of dandruff and scalp itching. Previous use of Nizoral shampoo provided temporary relief. -Order thyroid function tests to rule out thyroid-related hair loss. -Prescribe Nizoral shampoo, to be used twice a week.  Cervical Lymphadenopathy Painful cervical lymph nodes reported. -Possibly related to fungal scalp infection   Chronic Diarrhea No associated abdominal cramping, nausea, or vomiting. -Prescribe Bentyl, to be taken three times a day. If constipation occurs, reduce to twice a day.  General Health Maintenance / Followup Plans -Follow-up appointment to assess response to treatments and review results of ordered tests.          Meds ordered this encounter  Medications   ketoconazole (NIZORAL) 2 % shampoo    Sig: Apply 1 Application topically 2 (two) times a week.    Dispense:  120 mL    Refill:  3   dicyclomine (BENTYL) 10 MG capsule    Sig: Take 1  capsule (10 mg total) by mouth 3 (three) times daily before meals.    Dispense:  90 capsule    Refill:  1    Follow-up: Return in about 6 weeks (around 05/13/2023), or if symptoms worsen or fail to improve, for breast pain.       Hoy Register, MD, FAAFP. [facility] [place], [place] [phone]   04/01/2023, 2:52 PM

## 2023-04-02 LAB — TSH: TSH: 0.844 u[IU]/mL (ref 0.450–4.500)

## 2023-04-02 LAB — T3: T3, Total: 123 ng/dL (ref 71–180)

## 2023-04-02 LAB — T4, FREE: Free T4: 1.22 ng/dL (ref 0.82–1.77)

## 2023-04-07 DIAGNOSIS — Z419 Encounter for procedure for purposes other than remedying health state, unspecified: Secondary | ICD-10-CM | POA: Diagnosis not present

## 2023-04-30 ENCOUNTER — Other Ambulatory Visit: Payer: Medicaid Other

## 2023-05-07 DIAGNOSIS — Z419 Encounter for procedure for purposes other than remedying health state, unspecified: Secondary | ICD-10-CM | POA: Diagnosis not present

## 2023-05-31 ENCOUNTER — Encounter: Payer: Self-pay | Admitting: Family Medicine

## 2023-06-03 ENCOUNTER — Other Ambulatory Visit: Payer: Self-pay | Admitting: Family Medicine

## 2023-06-03 DIAGNOSIS — E669 Obesity, unspecified: Secondary | ICD-10-CM

## 2023-06-07 DIAGNOSIS — Z419 Encounter for procedure for purposes other than remedying health state, unspecified: Secondary | ICD-10-CM | POA: Diagnosis not present

## 2023-06-13 DIAGNOSIS — Z3202 Encounter for pregnancy test, result negative: Secondary | ICD-10-CM | POA: Diagnosis not present

## 2023-06-13 DIAGNOSIS — Z3200 Encounter for pregnancy test, result unknown: Secondary | ICD-10-CM | POA: Diagnosis not present

## 2023-06-13 DIAGNOSIS — Z3042 Encounter for surveillance of injectable contraceptive: Secondary | ICD-10-CM | POA: Diagnosis not present

## 2023-06-13 DIAGNOSIS — Z309 Encounter for contraceptive management, unspecified: Secondary | ICD-10-CM | POA: Diagnosis not present

## 2023-06-19 ENCOUNTER — Encounter: Payer: Self-pay | Admitting: Physician Assistant

## 2023-06-19 ENCOUNTER — Ambulatory Visit: Payer: Medicaid Other | Attending: Physician Assistant | Admitting: Physician Assistant

## 2023-06-19 VITALS — BP 119/81 | HR 94 | Wt 196.0 lb

## 2023-06-19 DIAGNOSIS — L219 Seborrheic dermatitis, unspecified: Secondary | ICD-10-CM

## 2023-06-19 DIAGNOSIS — M7521 Bicipital tendinitis, right shoulder: Secondary | ICD-10-CM

## 2023-06-19 DIAGNOSIS — M25511 Pain in right shoulder: Secondary | ICD-10-CM | POA: Diagnosis not present

## 2023-06-19 DIAGNOSIS — M79641 Pain in right hand: Secondary | ICD-10-CM

## 2023-06-19 DIAGNOSIS — G44229 Chronic tension-type headache, not intractable: Secondary | ICD-10-CM

## 2023-06-19 DIAGNOSIS — Z758 Other problems related to medical facilities and other health care: Secondary | ICD-10-CM

## 2023-06-19 DIAGNOSIS — M6240 Contracture of muscle, unspecified site: Secondary | ICD-10-CM | POA: Diagnosis not present

## 2023-06-19 DIAGNOSIS — Z603 Acculturation difficulty: Secondary | ICD-10-CM

## 2023-06-19 DIAGNOSIS — M79642 Pain in left hand: Secondary | ICD-10-CM

## 2023-06-19 DIAGNOSIS — L811 Chloasma: Secondary | ICD-10-CM

## 2023-06-19 MED ORDER — TRETINOIN 0.05 % EX CREA: TOPICAL_CREAM | Freq: Every day | CUTANEOUS | 2 refills | Status: DC

## 2023-06-19 MED ORDER — BUTALBITAL-APAP-CAFFEINE 50-325-40 MG PO TABS: 1.0000 | ORAL_TABLET | Freq: Four times a day (QID) | ORAL | 0 refills | Status: AC | PRN

## 2023-06-19 MED ORDER — MELOXICAM 15 MG PO TABS
15.0000 mg | ORAL_TABLET | Freq: Every day | ORAL | 2 refills | Status: DC
Start: 2023-06-19 — End: 2023-07-09

## 2023-06-19 MED ORDER — KETOCONAZOLE 2 % EX SHAM: 1.0000 | MEDICATED_SHAMPOO | CUTANEOUS | 3 refills | Status: DC

## 2023-06-19 NOTE — Progress Notes (Signed)
Janyth Pupa # [phone]

## 2023-06-19 NOTE — Patient Instructions (Addendum)
Tenosinovitis y tendinitis proximal del bceps Proximal Biceps Tendinitis and Tenosynovitis  El tendn proximal del bceps es un cordn resistente de tejido que conecta el msculo del bceps, en el frente de la parte superior del brazo, al omplato. La tendinitis es la inflamacin de un tendn. La tenosinovitis es la inflamacin de la membrana que rodea el tendn (vaina del tendn). Estas afecciones suelen ocurrir al [employer] tiempo y pueden dificultar la flexin del codo o la rotacin de la palma de la mano hacia arriba (supinacin). La tendinitis proximal del bceps y la tenosinovitis, por lo general, se deben al uso excesivo de la articulacin del hombro y el msculo del bceps. Estas afecciones suelen curarse en 6 semanas. La tendinitis proximal del bceps puede tambin implicar un esguince de grado 1 o 2 del tendn. Una distensin de [place] 1 es leve. Hay un leve tirn del tendn sin estiramiento ni desgarro notorio del tendn. Por lo general, no implica la prdida de fuerza en el msculo del bceps. Una distensin de [place] 2 es [place]. Hay un pequeo desgarro en el tendn. El tendn se estira y la fuerza del msculo del bceps disminuye. Cules son las causas? Esta afeccin puede ser causada por lo siguiente: Aumento repentino de la cantidad de actividades en las que [place] el codo y el msculo bceps. El aumento repentino de la intensidad de las actividades tambin puede causar esta afeccin. Uso excesivo del msculo del bceps. Esto puede suceder al [employer] mismo movimiento una y [place], por ejemplo: Girar la palma de la mano [place] arriba. Enderezamiento forzado del codo. Flexionar el codo. Un golpe fuerte y Engineering geologist en el codo o una lesin en el codo. Esto es poco frecuente. Qu incrementa el riesgo? Los siguientes factores pueden hacer que sea ms propenso a Aeronautical engineer afeccin: Microbiologist de contacto. Practicar deportes que impliquen realizar lanzamientos o movimientos por  encima del hombro, como deportes de [place], gimnasia, levantamiento de pesas o fisicoculturismo. Realizar tareas que impliquen actividad fsica. Tener poca fuerza y flexibilidad en el brazo y [place]. Cules son los signos o sntomas? Los sntomas de esta afeccin pueden incluir los siguientes: Dolor e inflamacin en la parte de adelante del hombro. Sensacin de calor en la parte de adelante del hombro. Imposibilidad de mover el hombro y el codo. Esto se denomina amplitud de movimiento limitada. Un crujido (crepitacin) al mover o tocar el hombro o la parte superior del brazo. En algunos casos, los sntomas pueden volver a aparecer despus del tratamiento y pueden perdurar (ser crnicos). Cmo se diagnostica? Esta afeccin se diagnostica en funcin de lo siguiente: Los sntomas. Sus antecedentes mdicos. Un examen fsico. Radiografa, resonancia magntica (RM) o ecografa, si es necesario. Cmo se trata? El tratamiento de esta afeccin depende de la gravedad de la lesin. Puede incluir lo siguiente: Leisure centre manager. Aplicar hielo en la zona de la lesin. Realizar fisioterapia. El mdico tambin podr indicar lo siguiente: Medicamentos para tratar Chief Technology Officer y la inflamacin. Ondas sonoras para tratar el msculo lesionado (terapia por ultrasonido). Medicamentos que se Estate agent (corticoesteroides). Medicamentos para adormecer la zona (anestsicos locales). Ciruga. Esta se realiza si los dems tratamientos no han funcionado. Siga estas indicaciones en su casa: Control del dolor, la rigidez y la hinchazn     Aplique hielo sobre la zona lesionada si se lo indican. Ponga el hielo en una bolsa plstica. Coloque una toalla entre la piel y Copy. Aplique el hielo durante  20 minutos, 2 o 3 veces por da. Si se lo indican, aplique calor en la zona afectada antes de hacer ejercicio. Use la fuente de calor que el mdico le recomiende, como una compresa de  calor hmedo o una almohadilla trmica. Coloque una toalla entre la piel y la fuente de Airline pilot. Aplique calor durante 20 a 30 minutos. Si la piel se le pone de color rojo brillante, retire el hielo o el calor de inmediato para evitar daos en la zona. El [place] de dao es mayor si no puede sentir dolor, Airline pilot o fro. Mueva los dedos de la mano con frecuencia para reducir la rigidez y la hinchazn. Mantenga la zona de la lesin levantada (elevada) por encima del nivel del corazn mientras est acostado. Actividad Es posible que deba Medical illustrator objetos. Pregntele al mdico cunto peso puede levantar sin correr [employer]. Evite las [employer] causen dolor o empeoren la afeccin. Haga ejercicios como se lo haya indicado el mdico. Retome sus actividades normales como se lo haya indicado el mdico. Pregntele al mdico qu actividades son seguras para usted. Indicaciones generales Use los medicamentos de venta libre y los recetados solamente como se lo haya indicado el mdico. No consuma ningn producto que contenga nicotina o tabaco. Estos productos incluyen cigarrillos, tabaco para Theatre manager y aparatos de vapeo, como los Administrator, Civil Service. Estos pueden retrasar la recuperacin. Si necesita ayuda para dejar de consumir estos productos, consulte al mdico. Concurra a todas las visitas de seguimiento. El [address] un control de su lesin y de [place]. Cmo se previene? Precaliente y elongue adecuadamente antes de hacer actividad fsica. Reljese y elongue despus de hacer actividad fsica. Dele al cuerpo tiempo para [employer] perodos de [place] fsica. Asegrese de usar el equipo que sea apto para usted. Tome medidas de seguridad y sea responsable al hacer [place], para evitar las cadas. Mantenga un buen estado fsico, que incluye lo siguiente: [place]. Flexibilidad. Salud del corazn (buen estado cardiovascular). La capacidad de usar msculos durante un  largo tiempo (resistencia). Comunquese con un mdico si: Los sntomas empeoran o no mejoran despus de 2 semanas de tratamiento. Tiene nuevos sntomas. Siente dolor intenso. Esta informacin no tiene Theme park manager el consejo del mdico. Asegrese de hacerle al mdico cualquier pregunta que tenga. Document Revised: 03/27/2022 Document Reviewed: 03/27/2022 Elsevier Patient Education  2024 Elsevier Inc.    [place] de Dupuytren Dupuytren's Contracture La contractura de Dupuytren es una afeccin con la que puede ser difcil lidiar. El mdico y las personas cercanas a usted pueden ayudarlo a Engineer, maintenance (IT). Cuando una persona tiene Copy, el tejido debajo de la piel de la palma de la mano se engrosa. Esto hace que uno o ms dedos se curven hacia adentro (se contraigan) hacia la palma de la [place]. Despus de un tiempo, es posible que los dedos no puedan enderezarse. Este trastorno [employer] todos o algunos de los dedos y la palma de ambas manos. La contractura de Dupuytren es una afeccin de larga duracin (crnica) que se desarrolla (evoluciona) lentamente con el tiempo. Si bien todava no hay una cura, los sntomas pueden controlarse y la evolucin de la enfermedad puede hacerse ms lenta con tratamiento. Generalmente, esta afeccin no es peligrosa, pero puede interferir con las tareas cotidianas. Cules son las causas?  La causa de esta afeccin es el engrosamiento y la tensin de los tejidos (fascia) de la palma de la [place]. Cuando el tejido se engrosa, tracciona los cordones de  tejido (tendones) que controlan el movimiento de los dedos. Esto hace que los dedos se contraigan. Se desconoce la causa del engrosamiento de los tejidos. Sin embargo, esta afeccin suele transmitirse de padres a hijos (es hereditaria). Qu incrementa el riesgo? Ser mayor de 40 aos de edad. Ser hombre. Tener antecedentes familiares de esta afeccin. Consumir algn producto que contenga tabaco, lo que  incluye cigarrillos, tabaco de Theatre manager y Administrator, Civil Service. Beber alcohol en exceso. Tener diabetes. Tener un trastorno convulsivo. Cules son los signos o sntomas? Los sntomas iniciales de esta afeccin pueden incluir lo siguiente: Piel de la mano gruesa y [place]. Uno o ms bultos (ndulos) en la palma de la mano. A veces, los bultos pueden doler o ser sensibles al tacto. Los sntomas tardos de esta afeccin pueden [employer] siguientes: Cordones gruesos de tejido en la palma de la [place]. Curvatura de los dedos hacia la palma de la [place]. Incapacidad de enderezar los dedos para llevarlos a su posicin habitual. Molestias al agarrar objetos o sostenerlos. Cmo se diagnostica? Esta afeccin se diagnostica mediante un examen fsico. Puede incluir: Observar las manos y tocar las palmas de las manos. Esto se hace para verificar si hay bultos y engrosamiento de los tejidos. Medir el movimiento de los dedos. Realizar la prueba de [place]. Es posible que, durante esta prueba, deba intentar poner la mano sobre una superficie, con la palma hacia abajo y los dedos extendidos. Cmo se trata? No hay cura para esta afeccin, pero el tratamiento puede Acupuncturist y hacer que los sntomas sean ms controlables. Las opciones de tratamiento pueden incluir: Fisioterapia. Esta puede fortalecer la mano y aumentar su flexibilidad. Terapia ocupacional. Esta puede ayudar con tareas cotidianas que podran haberse vuelto ms difciles. Inyecciones. Pueden inyectarle ciertas sustancias en la mano, por ejemplo: Medicamentos que ayudan a disminuir la hinchazn y Chief Technology Officer (corticoesteroides). Enzimas (colagenasa) para debilitar el tejido engrosado. Es posible que, despus de [place] inyeccin de colagenasa, el mdico pueda estirarle los dedos. Aponeurotoma con aguja. Se coloca una aguja a travs de la piel y se la inserta en el tejido. Al mover la Theme park manager los tejidos, se puede debilitar o romper  el tejido engrosado. Ciruga. Puede ser necesaria si la afeccin le causa malestar o interfiere en las actividades cotidianas. Generalmente, se necesita fisioterapia despus de la [place]. Ningn tratamiento garantiza una cura para esta enfermedad. Es frecuente que esta afeccin vuelva a Research officer, trade union. Siga estas indicaciones en su casa: Cuidado de la [place] estas medidas para ayudar a proteger la mano de una posible lesin: Use herramientas que tengan mangos acolchados. Use guantes de proteccin cuando trabaje con las manos. Evite los movimientos repetidos de Engineer, site. Indicaciones generales Use los medicamentos de venta libre y los recetados solo como se lo haya indicado el mdico. Controle cualquier otra afeccin que tenga, como diabetes. Si debe hacer fisioterapia, realice los ejercicios como se lo haya indicado el mdico. No consuma ningn producto que contenga nicotina o tabaco. Estos productos incluyen cigarrillos, tabaco para Theatre manager y aparatos de vapeo, como los Administrator, Civil Service. Si necesita ayuda para dejar de consumir estos productos, consulte al [employer]. Si bebe alcohol: Limite la cantidad que bebe a lo siguiente: De 0 a 1 medida al da si es [place]. De 0 a 2 medidas al da si es varn. Sepa cunta cantidad de alcohol hay en las bebidas que toma. En los [address], una medida es una botella de cerveza de 12 oz (355 ml), un vaso de  vino de 5 oz (148 ml) o un vaso de una bebida alcohlica de alta graduacin de 1 oz (44 ml). Concurra a todas las visitas de seguimiento. El mdico lo controlar para saber si es necesario ajustar los tratamientos. Dnde obtener apoyo Dupuytren Research Group (Grupo de investigacin de Dupuytren): [email or web address].[place] Dupuytren Society (Sociedad Internacional de Dupuytren): dupuytren-online.info Comunquese con un mdico si: Los sntomas empeoran o aparecen sntomas nuevos. Tiene un dolor que empeora o que no mejora con los  medicamentos. Tiene dificultad o malestar al [employer] tareas cotidianas. Presenta adormecimiento o cosquilleos. Solicite ayuda de inmediato si: Siente dolor intenso. Los dedos [place] de color o se enfran de un modo inusual. Esta informacin no tiene Theme park manager el consejo del mdico. Asegrese de hacerle al mdico cualquier pregunta que tenga. Document Revised: 08/31/2022 Document Reviewed: 07/04/2022 Elsevier Patient Education  2024 [employer].

## 2023-06-19 NOTE — Progress Notes (Signed)
Patient ID: Alyssa Blackwell, female   DOB: 05/27/1986, 37 y.o.   MRN: [id number]     Alyssa Blackwell, is a 37 y.o. female  [id number]  [id number]  DOB - Jan 25, 1986  Chief Complaint  Patient presents with   Referral    Ortho and derm.   Shoulder Pain   Medication Refill       Subjective:   Alyssa Blackwell is a 37 y.o. female here today for several concerns  Middle Finger L hand, 3rd finger.  X2 months.  Pain that is worse with movement.  NKI.  Feels like finger burns and feels tight on the top side oof her finger.  Finger feels stiff and clicks sometimes.    Has had B carpal tunnel surgery release about 1 year ago.  Still has similar symptoms  R shoulder pain esp with lifting she feels pain and hears a cracking.  Feels like something is coming unhooked in her shoulder for about 1 month. NKI.  She does a lot of cleaning and lifting and repetitive use of her R arm.    Needs RF for migraines and needs refills for melasma and seborrhea  No problems updated.  ALLERGIES: No Known Allergies  PAST MEDICAL HISTORY: Past Medical History:  Diagnosis Date   Arrhythmia    occurs only in pregnancy, had evaluation in [place] with 5th pregnancy, normal evaluation per pt   Panic attacks     MEDICATIONS AT HOME: Prior to Admission medications   Medication Sig Start Date End Date Taking? Authorizing Provider  cetirizine (ZYRTEC) 10 MG tablet Take 1 tablet (10 mg total) by mouth daily. 08/29/22  Yes Hoy Register, MD  tretinoin (RETIN-A) 0.05 % cream Apply topically at bedtime. 06/19/23  Yes Georgian Co M, PA-C  acetaminophen (TYLENOL) 500 MG tablet Take 1 tablet (500 mg total) by mouth every 6 (six) hours. Patient not taking: Reported on 08/29/2022 04/29/22   Mesner, Barbara Cower, MD  butalbital-acetaminophen-caffeine (FIORICET) [phone] MG tablet Take 1 tablet by mouth every 6 (six) hours as needed for headache. 06/19/23 06/18/24  Anders Simmonds, PA-C   dicyclomine (BENTYL) 10 MG capsule Take 1 capsule (10 mg total) by mouth 3 (three) times daily before meals. 04/01/23   Hoy Register, MD  fluticasone (FLONASE) 50 MCG/ACT nasal spray Place 2 sprays into both nostrils daily. Patient not taking: Reported on 04/01/2023 08/29/22   Hoy Register, MD  ketoconazole (NIZORAL) 2 % shampoo Apply 1 Application topically 2 (two) times a week. 06/20/23   Anders Simmonds, PA-C  meloxicam (MOBIC) 15 MG tablet Take 1 tablet (15 mg total) by mouth daily. Prn pain 06/19/23   Arlando Leisinger, Marzella Schlein, PA-C    ROS: Neg HEENT Neg resp Neg cardiac Neg GI Neg GU Neg MS Neg psych Neg neuro  Objective:   Vitals:   06/19/23 1043  BP: 119/81  Pulse: 94  SpO2: 97%  Weight: 196 lb (88.9 kg)   Exam General appearance : Awake, alert, not in any distress. Speech Clear. Not toxic looking HEENT: Atraumatic and Normocephalic Neck: Supple, no JVD. No cervical lymphadenopathy.  Chest: Good air entry bilaterally, CTAB.  No rales/rhonchi/wheezing CVS: S1 S2 regular, no murmurs.  R shoulder-skin is warm, dry and intact without erythema. Unable to lift past height of shoulder.    Ligaments stable.  Proximal biceps tendon is tender.  L middle finger is partially flexed at rest and tender over the extensor tendon.  Extremities: B/L Lower Ext shows no edema,  both legs are warm to touch Neurology: Awake alert, and oriented X 3, CN II-XII intact, Non focal Skin: No Rash  Data Review Lab Results  Component Value Date   HGBA1C 5.5 08/14/2021   HGBA1C 5.1 08/12/2020    Assessment & Plan   1. Biceps tendinitis of right upper extremity Gave information is Spanish on this - meloxicam (MOBIC) 15 MG tablet; Take 1 tablet (15 mg total) by mouth daily. Prn pain  Dispense: 30 tablet; Refill: 2 - Ambulatory referral to Orthopedic Surgery  2. Pain in both hands Residual since surgery - meloxicam (MOBIC) 15 MG tablet; Take 1 tablet (15 mg total) by mouth daily. Prn pain   Dispense: 30 tablet; Refill: 2 - Ambulatory referral to Orthopedic Surgery - Ambulatory referral to Hand Surgery  3. Acute pain of right shoulder Due to #1 - meloxicam (MOBIC) 15 MG tablet; Take 1 tablet (15 mg total) by mouth daily. Prn pain  Dispense: 30 tablet; Refill: 2 - Ambulatory referral to Orthopedic Surgery  4. Contracture of tendon - meloxicam (MOBIC) 15 MG tablet; Take 1 tablet (15 mg total) by mouth daily. Prn pain  Dispense: 30 tablet; Refill: 2 - Ambulatory referral to Hand Surgery  5. Seborrheic dermatitis - ketoconazole (NIZORAL) 2 % shampoo; Apply 1 Application topically 2 (two) times a week.  Dispense: 49 mL; Refill: 3  6. Chronic tension-type headache, not intractable Use sparingly - butalbital-acetaminophen-caffeine (FIORICET) 50-325-40 MG tablet; Take 1 tablet by mouth every 6 (six) hours as needed for headache.  Dispense: 30 tablet; Refill: 0  7. Extensor tendon tightness, contracture - Ambulatory referral to Hand Surgery  8. Language barrier [facility] "Janyth Pupa" interpreters used and additional time performing visit was required.   9. Melasma resume - tretinoin (RETIN-A) 0.05 % cream; Apply topically at bedtime.  Dispense: 45 g; Refill: 2    Return in about 4 months (around 10/17/2023) for PCP for chronic conditions-[place].  The patient was given clear instructions to go to ER or return to medical center if symptoms don't improve, worsen or new problems develop. The patient verbalized understanding. The patient was told to call to get lab results if they haven't heard anything in the next week.      Georgian Co, PA-C [facility] [place], [place] [phone]   06/19/2023, 12:14 PM

## 2023-07-07 DIAGNOSIS — Z419 Encounter for procedure for purposes other than remedying health state, unspecified: Secondary | ICD-10-CM | POA: Diagnosis not present

## 2023-07-08 ENCOUNTER — Ambulatory Visit: Payer: Self-pay | Admitting: *Deleted

## 2023-07-08 NOTE — Telephone Encounter (Signed)
Noted  

## 2023-07-08 NOTE — Telephone Encounter (Signed)
  Chief Complaint: injury to R hand- fall Symptoms: swelling, bruising of hand- palm Frequency: patient fell 5 days ago Pertinent Negatives: Patient denies injury elsewhere Disposition: [] ED /[x] Urgent Care (no appt availability in office) / [] Appointment(In office/virtual)/ []  [facility]/ [] Home Care/ [] Refused Recommended Disposition /[] [place] [facility]/ []  Follow-up with PCP Additional Notes: No open appointment- UC appointment scheduled    Reason for Disposition  [1] After 3 days AND [2] pain not improving  Answer Assessment - Initial Assessment Questions 1. MECHANISM: "How did the injury happen?"     Patient fell and injured hand 2. ONSET: "When did the injury happen?" (Minutes or hours ago)      5 days ago 3. APPEARANCE of INJURY: "What does the injury look like?"      R hand- at junction of fourth and fifth digit 4. SEVERITY: "Can you use the hand normally?" "Can you bend your fingers into a ball and then fully open them?"     Yes- painful 5. SIZE: For cuts, bruises, or swelling, ask: "How large is it?" (e.g., inches or centimeters;  entire hand or wrist)      All purple for first 5 days, swelling is present 6. PAIN: "Is there pain?" If Yes, ask: "How bad is the pain?"  (Scale 1-10; or mild, moderate, severe)     7/10-constant  8. OTHER SYMPTOMS: "Do you have any other symptoms?"      no  Protocols used: Hand and Wrist Injury-A-AH

## 2023-07-09 ENCOUNTER — Ambulatory Visit (INDEPENDENT_AMBULATORY_CARE_PROVIDER_SITE_OTHER): Payer: Medicaid Other

## 2023-07-09 ENCOUNTER — Ambulatory Visit (HOSPITAL_COMMUNITY)
Admission: RE | Admit: 2023-07-09 | Discharge: 2023-07-09 | Disposition: A | Discharge status: Home / Self Care | Payer: Medicaid Other | Source: Ambulatory Visit | Attending: Internal Medicine | Admitting: Internal Medicine

## 2023-07-09 ENCOUNTER — Encounter (HOSPITAL_COMMUNITY): Payer: Self-pay

## 2023-07-09 VITALS — BP 117/88 | HR 86 | Temp 99.0°F | Resp 16 | Ht <= 58 in | Wt 195.0 lb

## 2023-07-09 DIAGNOSIS — S63656A Sprain of metacarpophalangeal joint of right little finger, initial encounter: Secondary | ICD-10-CM | POA: Diagnosis not present

## 2023-07-09 DIAGNOSIS — M79641 Pain in right hand: Secondary | ICD-10-CM | POA: Diagnosis not present

## 2023-07-09 MED ORDER — IBUPROFEN 800 MG PO TABS: 800.0000 mg | ORAL_TABLET | Freq: Three times a day (TID) | ORAL | 0 refills | Status: AC

## 2023-07-09 NOTE — ED Provider Notes (Signed)
[facility]    CSN: [id number] Arrival date & time: 07/09/23  0935      History   Chief Complaint Chief Complaint  Patient presents with   Hand Problem    hand injury- swelling, bruising - Entered by patient    HPI Alyssa Blackwell is a 37 y.o. female comes to the urgent care with 6 days history of right little finger pain.  Patient fell from a ladder and landed on the fourth and fifth fingers of the right hand.  Since the fall patient has experienced severe pain in the little finger of the right hand.  It was associated with some bruising and swelling when the incident occurred.  The bruising and swelling has improved but patient continues to have sharp throbbing pain in the right little finger.  Pain is aggravated by movement of the right middle finger.  No known relieving factors.  She is taking Tylenol with no improvement in pain HPI  Past Medical History:  Diagnosis Date   Arrhythmia    occurs only in pregnancy, had evaluation in [place] with 5th pregnancy, normal evaluation per pt   Panic attacks     Patient Active Problem List   Diagnosis Date Noted   Carpal tunnel syndrome, right upper limb    Carpal tunnel syndrome, left upper limb 11/17/2021   Bilateral hand numbness 10/05/2021   ASCUS with positive high risk HPV cervical 03/13/2021   Tachycardia 12/29/2020   Vulvovaginitis 10/10/2020   Language barrier 10/10/2020   Dental abscess 10/10/2020   History of gestational diabetes in prior pregnancy, currently pregnant 08/12/2020    Past Surgical History:  Procedure Laterality Date   CARPAL TUNNEL RELEASE Right 01/15/2022   Procedure: RIGHT CARPAL TUNNEL RELEASE;  Surgeon: Marlyne Beards, MD;  Location: [facility];  Service: Orthopedics;  Laterality: Right;   CARPAL TUNNEL RELEASE Left 03/19/2022   Procedure: LEFT CARPAL TUNNEL RELEASE;  Surgeon: Marlyne Beards, MD;  Location: [facility];  Service:  Orthopedics;  Laterality: Left;    OB History     Gravida  6   Para  5   Term  4   Preterm  1   AB  1   Living  4      SAB  1   IAB  0   Ectopic  0   Multiple  0   Live Births  5            Home Medications    Prior to Admission medications   Medication Sig Start Date End Date Taking? Authorizing Provider  ibuprofen (ADVIL) 800 MG tablet Take 1 tablet (800 mg total) by mouth 3 (three) times daily. 07/09/23  Yes Gurtaj Ruz, Britta Mccreedy, MD  acetaminophen (TYLENOL) 500 MG tablet Take 1 tablet (500 mg total) by mouth every 6 (six) hours. 04/29/22   Mesner, Barbara Cower, MD  butalbital-acetaminophen-caffeine (FIORICET) 50-325-40 MG tablet Take 1 tablet by mouth every 6 (six) hours as needed for headache. 06/19/23 06/18/24  Anders Simmonds, PA-C  cetirizine (ZYRTEC) 10 MG tablet Take 1 tablet (10 mg total) by mouth daily. 08/29/22   Hoy Register, MD  dicyclomine (BENTYL) 10 MG capsule Take 1 capsule (10 mg total) by mouth 3 (three) times daily before meals. 04/01/23   Hoy Register, MD  ketoconazole (NIZORAL) 2 % shampoo Apply 1 Application topically 2 (two) times a week. 06/20/23   Anders Simmonds, PA-C  tretinoin (RETIN-A) 0.05 % cream Apply topically at  bedtime. 06/19/23   Anders Simmonds, PA-C    Family History Family History  Problem Relation Age of Onset   Heart attack Father    Heart disease Father    Hypertension Paternal Grandmother    Healthy Mother     Social History Social History   Tobacco Use   Smoking status: Never   Smokeless tobacco: Never  Vaping Use   Vaping status: Never Used  Substance Use Topics   Alcohol use: Never   Drug use: Never     Allergies   Patient has no known allergies.   Review of Systems Review of Systems As per HPI  Physical Exam Triage Vital Signs ED Triage Vitals  Encounter Vitals Group     BP 07/09/23 0955 117/88     Systolic BP Percentile --      Diastolic BP Percentile --      Pulse Rate 07/09/23 0955  86     Resp 07/09/23 0955 16     Temp 07/09/23 0955 99 F (37.2 C)     Temp Source 07/09/23 0955 Oral     SpO2 07/09/23 0955 98 %     Weight 07/09/23 0954 195 lb (88.5 kg)     Height 07/09/23 0954 (!) 5" (0.127 m)     Head Circumference --      Peak Flow --      Pain Score 07/09/23 0953 7     Pain Loc --      Pain Education --      Exclude from Growth Chart --    No data found.  Updated Vital Signs BP 117/88 (BP Location: Right Arm)   Pulse 86   Temp 99 F (37.2 C) (Oral)   Resp 16   Ht (!) 5" (0.127 m)   Wt 88.5 kg   LMP  (LMP Unknown)   SpO2 98%   Breastfeeding No   BMI 5484.00 kg/m   Visual Acuity Right Eye Distance:   Left Eye Distance:   Bilateral Distance:    Right Eye Near:   Left Eye Near:    Bilateral Near:     Physical Exam Vitals and nursing note reviewed.  Constitutional:      General: She is not in acute distress.    Appearance: She is not ill-appearing.  Cardiovascular:     Rate and Rhythm: Normal rate and regular rhythm.     Pulses: Normal pulses.     Heart sounds: Normal heart sounds.  Pulmonary:     Effort: Pulmonary effort is normal.     Breath sounds: Normal breath sounds.  Musculoskeletal:     Comments: Full range of motion of the metacarpophalangeal joint of the right little finger.  Range of motion is associated with pain.  No bruising or swelling noted  Neurological:     Mental Status: She is alert.      UC Treatments / Results  Labs (all labs ordered are listed, but only abnormal results are displayed) Labs Reviewed - No data to display  EKG   Radiology DG Hand Complete Right  Result Date: 07/09/2023 CLINICAL DATA:  Right hand pain for 6 days. Fourth and fifth fingers got spread apart. Pain at the base of the fifth finger. EXAM: RIGHT HAND - COMPLETE 3+ VIEW COMPARISON:  None Available. FINDINGS: There is no evidence of fracture or dislocation. There is no evidence of arthropathy or other focal bone abnormality. Soft  tissues are unremarkable. IMPRESSION: Negative. Electronically Signed  By: Amie Portland M.D.   On: 07/09/2023 10:50    Procedures Procedures (including critical care time)  Medications Ordered in UC Medications - No data to display  Initial Impression / Assessment and Plan / UC Course  I have reviewed the triage vital signs and the nursing notes.  Pertinent labs & imaging results that were available during my care of the patient were reviewed by me and considered in my medical decision making (see chart for details).     1.  Sprain of the MCP joint of the right little finger: Official read indicates x-ray of the right little finger is negative for fracture. Ibuprofen 800 mg 3 times daily as needed for pain Cold compress as needed Gentle range of motion exercises Return precautions given. Final Clinical Impressions(s) / UC Diagnoses   Final diagnoses:  Sprain of metacarpophalangeal (MCP) joint of right little finger, initial encounter     Discharge Instructions      Descanse y eleve la zona dolorosa afectada.   Aplique compresas fras de forma intermitente segn sea necesario.   Por favor tome los medicamentos con la comida. A medida que el dolor disminuye, comience las actividades normales lentamente segn lo tolere.  Llame si los sntomas persisten.  Rest and elevate the affected painful area.   Apply cold compresses intermittently as needed.   Please take medication with food. As pain recedes, begin normal activities slowly as tolerated.  Call if symptoms persist.    ED Prescriptions     Medication Sig Dispense Auth. Provider   ibuprofen (ADVIL) 800 MG tablet Take 1 tablet (800 mg total) by mouth 3 (three) times daily. 21 tablet Tycho Cheramie, Britta Mccreedy, MD      PDMP not reviewed this encounter.   Merrilee Jansky, MD 07/09/23 1134

## 2023-07-09 NOTE — Discharge Instructions (Addendum)
Descanse y eleve la zona dolorosa afectada.   Aplique compresas fras de forma intermitente segn sea necesario.   Por favor tome los medicamentos con la comida. A medida que el dolor disminuye, comience las actividades normales lentamente segn lo tolere.  Llame si los sntomas persisten.  Rest and elevate the affected painful area.   Apply cold compresses intermittently as needed.   Please take medication with food. As pain recedes, begin normal activities slowly as tolerated.  Call if symptoms persist.

## 2023-07-09 NOTE — ED Triage Notes (Signed)
Patient here today with c/o right hand pain X 6 days. Patient states that her 4th and 5th fingers got spread apart and she has been having increased pain at the base of her 5th finger. She has been taking Tylenol with no relief.

## 2023-07-24 ENCOUNTER — Ambulatory Visit: Payer: Medicaid Other | Admitting: Orthopedic Surgery

## 2023-08-05 ENCOUNTER — Other Ambulatory Visit (HOSPITAL_COMMUNITY): Payer: Self-pay

## 2023-08-05 DIAGNOSIS — E669 Obesity, unspecified: Secondary | ICD-10-CM | POA: Diagnosis not present

## 2023-08-05 DIAGNOSIS — R5383 Other fatigue: Secondary | ICD-10-CM | POA: Diagnosis not present

## 2023-08-05 DIAGNOSIS — Z8632 Personal history of gestational diabetes: Secondary | ICD-10-CM | POA: Diagnosis not present

## 2023-08-05 DIAGNOSIS — R000 Tachycardia, unspecified: Secondary | ICD-10-CM | POA: Diagnosis not present

## 2023-08-05 MED ORDER — WEGOVY 0.25 MG/0.5ML ~~LOC~~ SOAJ
0.2500 mg | SUBCUTANEOUS | 0 refills | Status: DC
  Filled 2023-08-05 – 2023-08-09 (×3): qty 2, 28d supply, fill #0

## 2023-08-05 MED ORDER — WEGOVY 0.5 MG/0.5ML ~~LOC~~ SOAJ
0.5000 mg | SUBCUTANEOUS | 0 refills | Status: DC
  Filled 2023-08-30: qty 2, 28d supply, fill #0

## 2023-08-07 DIAGNOSIS — Z419 Encounter for procedure for purposes other than remedying health state, unspecified: Secondary | ICD-10-CM | POA: Diagnosis not present

## 2023-08-09 ENCOUNTER — Other Ambulatory Visit: Payer: Self-pay

## 2023-08-09 ENCOUNTER — Other Ambulatory Visit (HOSPITAL_COMMUNITY): Payer: Self-pay

## 2023-08-14 ENCOUNTER — Ambulatory Visit: Payer: Medicaid Other | Admitting: Physician Assistant

## 2023-08-30 ENCOUNTER — Other Ambulatory Visit: Payer: Self-pay

## 2023-09-02 ENCOUNTER — Other Ambulatory Visit: Payer: Self-pay

## 2023-09-07 DIAGNOSIS — Z419 Encounter for procedure for purposes other than remedying health state, unspecified: Secondary | ICD-10-CM | POA: Diagnosis not present

## 2023-09-10 ENCOUNTER — Other Ambulatory Visit: Payer: Self-pay

## 2023-09-10 DIAGNOSIS — Z3042 Encounter for surveillance of injectable contraceptive: Secondary | ICD-10-CM | POA: Diagnosis not present

## 2023-09-10 DIAGNOSIS — Z3009 Encounter for other general counseling and advice on contraception: Secondary | ICD-10-CM | POA: Diagnosis not present

## 2023-09-17 ENCOUNTER — Ambulatory Visit: Payer: Medicaid Other | Admitting: Nurse Practitioner

## 2023-10-05 DIAGNOSIS — Z419 Encounter for procedure for purposes other than remedying health state, unspecified: Secondary | ICD-10-CM | POA: Diagnosis not present

## 2023-10-07 ENCOUNTER — Other Ambulatory Visit: Payer: Self-pay

## 2023-10-07 ENCOUNTER — Other Ambulatory Visit (HOSPITAL_COMMUNITY): Payer: Self-pay

## 2023-10-07 DIAGNOSIS — E669 Obesity, unspecified: Secondary | ICD-10-CM | POA: Diagnosis not present

## 2023-10-07 DIAGNOSIS — R000 Tachycardia, unspecified: Secondary | ICD-10-CM | POA: Diagnosis not present

## 2023-10-07 DIAGNOSIS — Z8632 Personal history of gestational diabetes: Secondary | ICD-10-CM | POA: Diagnosis not present

## 2023-10-07 MED ORDER — WEGOVY 1.7 MG/0.75ML ~~LOC~~ SOAJ: 1.7000 mg | SUBCUTANEOUS | 0 refills | Status: DC

## 2023-10-07 MED ORDER — WEGOVY 1 MG/0.5ML ~~LOC~~ SOAJ
1.0000 mg | SUBCUTANEOUS | 0 refills | Status: DC
  Filled 2023-10-07 (×2): qty 2, 28d supply, fill #0

## 2023-10-07 MED ORDER — WEGOVY 1.7 MG/0.75ML ~~LOC~~ SOAJ
1.7000 mg | SUBCUTANEOUS | 0 refills | Status: AC
Start: 2023-10-07 — End: ?
  Filled 2023-10-07 – 2023-10-30 (×2): qty 3, 28d supply, fill #0

## 2023-10-18 ENCOUNTER — Ambulatory Visit: Payer: Self-pay | Admitting: Family Medicine

## 2023-10-18 NOTE — Telephone Encounter (Signed)
 Interpreter [place] ID # [id number] Chief Complaint: severe cough , SOB with coughing Symptoms: constant coughing, rib pain, lungs hurt breathing in , heart racing. Face / nose / cheeks feel heavy , SOB with coughing  Frequency: 6 days but worsening last 3  Pertinent Negatives: Patient denies chest pain no fever reported Disposition: [] ED /[x] Urgent Care (no appt availability in office) / [] Appointment(In office/virtual)/ []  [facility]/ [] Home Care/ [] Refused Recommended Disposition /[] [facility]/ []  Follow-up with PCP Additional Notes:   No available appt with PCP or other providers. Recommended UC now and if sx worsen go to ED.       Copied from CRM [id number]. Topic: Clinical - Red Word Triage >> Oct 18, 2023  9:56 AM Franchot Heidelberg wrote: Red Word that prompted transfer to Nurse Triage: SOB difficulty breathing Reason for Disposition  [1] MILD difficulty breathing (e.g., minimal/no SOB at rest, SOB with walking, pulse <100) AND [2] NEW-onset or WORSE than normal  Answer Assessment - Initial Assessment Questions 1. RESPIRATORY STATUS: "Describe your breathing?" (e.g., wheezing, shortness of breath, unable to speak, severe coughing)      Shortness of breath from coughing  2. ONSET: "When did this breathing problem begin?"      6 days but last 3 worse 3. PATTERN "Does the difficult breathing come and go, or has it been constant since it started?"      constant 4. SEVERITY: "How bad is your breathing?" (e.g., mild, moderate, severe)    - MILD: No SOB at rest, mild SOB with walking, speaks normally in sentences, can lie down, no retractions, pulse < 100.    - MODERATE: SOB at rest, SOB with minimal exertion and prefers to sit, cannot lie down flat, speaks in phrases, mild retractions, audible wheezing, pulse 100-120.    - SEVERE: Very SOB at rest, speaks in single words, struggling to breathe, sitting hunched forward, retractions, pulse > 120      SOB with coughing rib  pain 5. RECURRENT SYMPTOM: "Have you had difficulty breathing before?" If Yes, ask: "When was the last time?" and "What happened that time?"      Na  6. CARDIAC HISTORY: "Do you have any history of heart disease?" (e.g., heart attack, angina, bypass surgery, angioplasty)      na 7. LUNG HISTORY: "Do you have any history of lung disease?"  (e.g., pulmonary embolus, asthma, emphysema)     na 8. CAUSE: "What do you think is causing the breathing problem?"      Cough  9. OTHER SYMPTOMS: "Do you have any other symptoms? (e.g., dizziness, runny nose, cough, chest pain, fever)     Severe coughing , SOB with coughing rib pain lungs hurt breathing in , heart racing face/ nose cheeks heavy  10. O2 SATURATION MONITOR:  "Do you use an oxygen saturation monitor (pulse oximeter) at home?" If Yes, ask: "What is your reading (oxygen level) today?" "What is your usual oxygen saturation reading?" (e.g., 95%)       na 11. PREGNANCY: "Is there any chance you are pregnant?" "When was your last menstrual period?"       na 12. TRAVEL: "Have you traveled out of the country in the last month?" (e.g., travel history, exposures)       na  Protocols used: Breathing Difficulty-A-AH

## 2023-10-22 ENCOUNTER — Ambulatory Visit: Payer: Medicaid Other | Admitting: Nurse Practitioner

## 2023-10-23 ENCOUNTER — Encounter: Payer: Self-pay | Admitting: Family Medicine

## 2023-10-23 ENCOUNTER — Ambulatory Visit: Payer: Medicaid Other | Attending: Family Medicine | Admitting: Family Medicine

## 2023-10-23 VITALS — BP 110/74 | HR 90 | Ht 60.0 in | Wt 181.8 lb

## 2023-10-23 DIAGNOSIS — Z13228 Encounter for screening for other metabolic disorders: Secondary | ICD-10-CM | POA: Diagnosis not present

## 2023-10-23 DIAGNOSIS — L219 Seborrheic dermatitis, unspecified: Secondary | ICD-10-CM | POA: Diagnosis not present

## 2023-10-23 DIAGNOSIS — L811 Chloasma: Secondary | ICD-10-CM

## 2023-10-23 DIAGNOSIS — J328 Other chronic sinusitis: Secondary | ICD-10-CM | POA: Diagnosis not present

## 2023-10-23 DIAGNOSIS — Z131 Encounter for screening for diabetes mellitus: Secondary | ICD-10-CM | POA: Diagnosis not present

## 2023-10-23 DIAGNOSIS — B349 Viral infection, unspecified: Secondary | ICD-10-CM

## 2023-10-23 MED ORDER — ONDANSETRON HCL 4 MG PO TABS: 4.0000 mg | ORAL_TABLET | Freq: Three times a day (TID) | ORAL | 0 refills | Status: AC | PRN

## 2023-10-23 MED ORDER — PREDNISONE 20 MG PO TABS: 20.0000 mg | ORAL_TABLET | Freq: Every day | ORAL | 0 refills | Status: AC

## 2023-10-23 MED ORDER — TRETINOIN 0.05 % EX CREA: TOPICAL_CREAM | Freq: Every day | CUTANEOUS | 2 refills | Status: AC

## 2023-10-23 MED ORDER — KETOCONAZOLE 2 % EX SHAM: 1.0000 | MEDICATED_SHAMPOO | CUTANEOUS | 3 refills | Status: AC

## 2023-10-23 MED ORDER — HYDROCOD POLI-CHLORPHE POLI ER 10-8 MG/5ML PO SUER: 5.0000 mL | Freq: Two times a day (BID) | ORAL | 0 refills | Status: AC | PRN

## 2023-10-23 NOTE — Progress Notes (Signed)
 Subjective:  Patient ID: Alyssa Blackwell, female    DOB: [birth year]  Age: 38 y.o. MRN: [id number]  CC: Medical Management of Chronic Issues (Abdominal pain,vomiting, diarrhea for 3 days/Cough at night for 3 weeks)     Discussed the use of AI scribe software for clinical note transcription with the patient, who gave verbal consent to proceed.  History of Present Illness The patient, with a history of dandruff and a facial skin condition managed with topical medications, presents with abdominal pain, vomiting, diarrhea, and cough for three days. She reports that everyone in her household is experiencing similar symptoms. She also reports that the children in her household tested positive for the flu twelve days ago. The patient also has the flu with a severe cough, especially at night. She has been using a nebulizer and cetirizine at night, but nothing seems to improve her symptoms. She also reports a sensation of pressure in her forehead and cheeks, which worsens when she bends down. She has been taking Zofran for nausea, but she still feels dizzy all day with a sensation that she is going to vomit. When she starts to vomit, she feels really bad because she keeps vomiting continuously. She also reports a headache, with her forehead feeling hot. She tested negative for COVID at home.  She is requesting a refill of her Nizoral shampoo and facial cream.    Past Medical History:  Diagnosis Date   Arrhythmia    occurs only in pregnancy, had evaluation in [place] with 5th pregnancy, normal evaluation per pt   Panic attacks     Past Surgical History:  Procedure Laterality Date   CARPAL TUNNEL RELEASE Right 01/15/2022   Procedure: RIGHT CARPAL TUNNEL RELEASE;  Surgeon: Marlyne Beards, MD;  Location: [facility];  Service: Orthopedics;  Laterality: Right;   CARPAL TUNNEL RELEASE Left 03/19/2022   Procedure: LEFT CARPAL TUNNEL RELEASE;  Surgeon: Marlyne Beards, MD;   Location: [facility];  Service: Orthopedics;  Laterality: Left;    Family History  Problem Relation Age of Onset   Heart attack Father    Heart disease Father    Hypertension Paternal Grandmother    Healthy Mother     Social History   Socioeconomic History   Marital status: Married    Spouse name: Not on file   Number of children: Not on file   Years of education: Not on file   Highest education level: Not on file  Occupational History   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  Tobacco Use   Smoking status: Never   Smokeless tobacco: Never  Vaping Use   Vaping status: Never Used  Substance and Sexual Activity   Alcohol use: Never   Drug use: Never   Sexual activity: Yes    Birth control/protection: None  Other Topics Concern   Not on file  Social History Narrative   Not on file   Social Drivers of Health   Financial Resource Strain: Low Risk  (10/07/2023)   Received from [facility]   Overall Financial Resource Strain (CARDIA)    Difficulty of Paying Living Expenses: Not very hard  Food Insecurity: No Food Insecurity (10/07/2023)   [facility] from [facility]   Hunger Vital Sign    Worried About Running Out of Food in the Last Year: Never true    Ran Out of Food in the Last Year: Never true  Transportation Needs: No Transportation Needs (10/07/2023)   Received from [facility] -  Administrator, Civil Service (Medical): No    Lack of Transportation (Non-Medical): No  Physical Activity: Not on file  Stress: No Stress Concern Present (06/19/2023)   [employer] of Occupational Health - Occupational Stress Questionnaire    Feeling of Stress : Not at all  Social Connections: Moderately Integrated (06/19/2023)   Social Connection and Isolation Panel [NHANES]    Frequency of Communication with Friends and Family: Once a week    Frequency of Social Gatherings with Friends and Family: Once a week    Attends Religious Services: 1 to 4 times per  year    Active Member of [employer] or Organizations: Yes    Attends Banker Meetings: 1 to 4 times per year    Marital Status: Married    No Known Allergies  Outpatient Medications Prior to Visit  Medication Sig Dispense Refill   acetaminophen (TYLENOL) 500 MG tablet Take 1 tablet (500 mg total) by mouth every 6 (six) hours. 30 tablet 0   butalbital-acetaminophen-caffeine (FIORICET) 50-325-40 MG tablet Take 1 tablet by mouth every 6 (six) hours as needed for headache. 30 tablet 0   cetirizine (ZYRTEC) 10 MG tablet Take 1 tablet (10 mg total) by mouth daily. 90 tablet 1   dicyclomine (BENTYL) 10 MG capsule Take 1 capsule (10 mg total) by mouth 3 (three) times daily before meals. 90 capsule 1   ibuprofen (ADVIL) 800 MG tablet Take 1 tablet (800 mg total) by mouth 3 (three) times daily. 21 tablet 0   Semaglutide-Weight Management (WEGOVY) 0.25 MG/0.5ML SOAJ Inject 0.25 mg into the skin once a week. 2 mL 0   Semaglutide-Weight Management (WEGOVY) 0.5 MG/0.5ML SOAJ Inject 0.5 mg into the skin once a week. 2 mL 0   Semaglutide-Weight Management (WEGOVY) 1 MG/0.5ML SOAJ Inject 1 mg into the skin once a week. 2 mL 0   Semaglutide-Weight Management (WEGOVY) 1.7 MG/0.75ML SOAJ Inject 1.7 mg into the skin once a week. 3 mL 0   Semaglutide-Weight Management (WEGOVY) 1.7 MG/0.75ML SOAJ Inject 1.7 mg into the skin once a week. 3 mL 0   ketoconazole (NIZORAL) 2 % shampoo Apply 1 Application topically 2 (two) times a week. 49 mL 3   tretinoin (RETIN-A) 0.05 % cream Apply topically at bedtime. 45 g 2   No facility-administered medications prior to visit.     ROS Review of Systems  Constitutional:  Negative for activity change and appetite change.  HENT:  Negative for sinus pressure and sore throat.   Respiratory:  Positive for cough. Negative for chest tightness, shortness of breath and wheezing.   Cardiovascular:  Negative for chest pain and palpitations.  Gastrointestinal:  Positive for  abdominal pain, diarrhea and vomiting. Negative for abdominal distention and constipation.  Genitourinary: Negative.   Musculoskeletal: Negative.   Psychiatric/Behavioral:  Negative for behavioral problems and dysphoric mood.     Objective:  BP 110/74   Pulse 90   Ht 5' (1.524 m)   Wt 181 lb 12.8 oz (82.5 kg)   SpO2 98%   BMI 35.51 kg/m      10/23/2023   11:23 AM 07/09/2023    9:55 AM 07/09/2023    9:54 AM  BP/Weight  Systolic BP 110 117   Diastolic BP 74 88   Wt. (Lbs) 181.8  195  BMI 35.51 kg/m2  5484 kg/m2      Physical Exam Constitutional:      Appearance: She is well-developed.  Cardiovascular:  Rate and Rhythm: Normal rate.     Heart sounds: Normal heart sounds. No murmur heard. Pulmonary:     Effort: Pulmonary effort is normal.     Breath sounds: Normal breath sounds. No wheezing or rales.  Chest:     Chest wall: No tenderness.  Abdominal:     General: Bowel sounds are normal. There is no distension.     Palpations: Abdomen is soft. There is no mass.     Tenderness: There is no abdominal tenderness.  Musculoskeletal:        General: Normal range of motion.     Right lower leg: No edema.     Left lower leg: No edema.  Neurological:     Mental Status: She is alert and oriented to person, place, and time.  Psychiatric:        Mood and Affect: Mood normal.        Latest Ref Rng & Units 08/29/2022   12:15 PM 12/08/2021    4:40 PM 11/29/2021   10:28 AM  CMP  Glucose 70 - 99 mg/dL 87   92   BUN 6 - 20 mg/dL 12   11   Creatinine 4.13 - 1.00 mg/dL 2.44   0.10   Sodium 272 - 144 mmol/L 143   137   Potassium 3.5 - 5.2 mmol/L 4.3   3.9   Chloride 96 - 106 mmol/L 105   99   CO2 20 - 29 mmol/L 22   23   Calcium 8.7 - 10.2 mg/dL 9.8   9.4   Total Protein 6.0 - 8.5 g/dL  7.8  7.6   Total Bilirubin 0.0 - 1.2 mg/dL  0.5  0.7   Alkaline Phos 44 - 121 IU/L  72  97   AST 0 - 40 IU/L  17  202   ALT 0 - 32 IU/L  34  293     Lipid Panel     Component Value  Date/Time   CHOL 152 08/14/2021 0944   TRIG 87 08/14/2021 0944   HDL 43 08/14/2021 0944   LDLCALC 92 08/14/2021 0944    CBC    Component Value Date/Time   WBC 4.7 11/29/2021 1028   WBC 12.4 (H) 01/19/2021 0128   RBC 4.69 11/29/2021 1028   RBC 4.09 01/19/2021 0128   HGB 15.2 11/29/2021 1028   HCT 43.5 11/29/2021 1028   PLT 199 11/29/2021 1028   MCV 93 11/29/2021 1028   MCH 32.4 11/29/2021 1028   MCH 31.8 01/19/2021 0128   MCHC 34.9 11/29/2021 1028   MCHC 33.4 01/19/2021 0128   RDW 12.7 11/29/2021 1028   LYMPHSABS 1.6 11/29/2021 1028   EOSABS 0.1 11/29/2021 1028   BASOSABS 0.0 11/29/2021 1028    Lab Results  Component Value Date   HGBA1C 5.5 08/14/2021       Assessment & Plan Viral Upper Respiratory Infection Negative COVID test.  -Children tested positive for the flu 12 days ago hence management will be unchanged given duration of symptoms so testing for flu not performed. -Suspected superimposed sinusitis with prolonged cough. Plan to treat with prednisone and cough medication. - Prescribe prednisone to reduce inflammation and help with secretions. - Prescribe Tussionex for cough and associated pain relief. - Advise to drink plenty of fluids and rest.  Gastroenteritis Symptoms suggest viral gastroenteritis. Consider hepatitis A due to gastrointestinal symptoms. - Prescribe Zofran for nausea as needed. - Order blood tests for hepatitis A, liver function, kidney function, and  diabetes.  Dandruff Chronic dandruff requiring ongoing treatment. - Provide refill for dandruff shampoo.  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 - Provide refill for face cream.      Meds ordered this encounter  Medications   ondansetron (ZOFRAN) 4 MG tablet    Sig: Take 1 tablet (4 mg total) by mouth every 8 (eight) hours as needed for nausea or vomiting.    Dispense:  20 tablet    Refill:  0   predniSONE (DELTASONE) 20 MG tablet    Sig:  Take 1 tablet (20 mg total) by mouth daily with breakfast.    Dispense:  5 tablet    Refill:  0   chlorpheniramine-HYDROcodone (TUSSIONEX) 10-8 MG/5ML    Sig: Take 5 mLs by mouth every 12 (twelve) hours as needed for cough.    Dispense:  120 mL    Refill:  0   ketoconazole (NIZORAL) 2 % shampoo    Sig: Apply 1 Application topically 2 (two) times a week.    Dispense:  49 mL    Refill:  3   tretinoin (RETIN-A) 0.05 % cream    Sig: Apply topically at bedtime.    Dispense:  45 g    Refill:  2    Follow-up: Return in about 6 months (around 04/24/2024) for Chronic medical conditions.       Hoy Register, MD, FAAFP. [facility] [place], [place] [phone]   10/23/2023, 11:56 AM

## 2023-10-23 NOTE — Patient Instructions (Signed)
 Infeccin de los senos paranasales en los adultos Sinus Infection, Adult La infeccin de los senos paranasales, tambin llamada sinusitis, es la inflamacin de los senos paranasales. Los senos paranasales son espacios vacos en los huesos alrededor del rostro. Los senos paranasales se encuentran en estos lugares: Alrededor de los ojos. En la mitad de la frente. Detrs de Architectural technologist. En los pmulos. Normalmente, la mucosidad drena a travs de los senos. Cuando los tejidos nasales se inflaman o hinchan, la mucosidad puede quedar atrapada o bloqueada. Esto fomenta la proliferacin de bacterias, virus y hongos, lo que produce infecciones. La mayora de las infecciones de los senos paranasales son provocadas por un virus. Una infeccin de los senos paranasales puede desarrollarse rpidamente. Puede durar hasta 4 semanas (aguda) o ms de 12 semanas (crnica). Con frecuencia, la infeccin de los senos paranasales aparece despus de un resfriado. Cules son las causas? Esta afeccin es causada por cualquier sustancia que inflame los senos o evite que la mucosidad drene. Esto incluye: Alergias. Asma. Infeccin por bacterias o virus. Deformidades u obstrucciones en la nariz o los senos paranasales. Crecimientos anormales en la nariz (plipos nasales). Agentes contaminantes, como sustancias qumicas o irritantes presentes en el aire. Infeccin por hongos. Esto es poco frecuente. Qu incrementa el riesgo? Es ms probable que tenga esta afeccin si: Tienen debilitado el sistema de defensa del organismo (sistema inmunitario). Nada o bucea mucho. Abusa de los [employer]. Fuma. Cules son los signos o sntomas? Los principales sntomas de esta afeccin son dolor y sensacin de presin alrededor de los senos paranasales afectados. Otros sntomas incluyen: Nariz tapada o congestin que dificulta la respiracin por la nariz. Secrecin espesa de color amarillo o verdoso que sale de la Clinical cytogeneticist. Dolor,  hinchazn y Airline pilot en los senos paranasales afectados. Tos que puede empeorar por la noche. Disminucin del sentido del olfato y del gusto. Mucosidad excesiva que se acumula en la garganta o la parte posterior de la nariz (goteo posnasal) y causa dolor de garganta o mal aliento. Cansancio (fatiga). Grant Ruts. Cmo se diagnostica? Esta afeccin se diagnostica en funcin de lo siguiente: Los sntomas. Sus antecedentes mdicos. Un examen fsico. Pruebas para averiguar si la afeccin es [place] o crnica. Pueden incluir: Revisar la nariz para ver si tiene plipos nasales. Observar los senos paranasales con un dispositivo que tiene una luz (endoscopio). Hacer pruebas para detectar alergias o bacterias. Pruebas de diagnstico por imgenes, como Health visitor (RM) o una exploracin por tomografa computarizada (TC). En contadas ocasiones, se puede realizar una biopsia de hueso para descartar tipos ms graves de infecciones por hongos en los senos paranasales. Cmo se trata? El tratamiento para la infeccin de los senos paranasales depende de la causa y de si la afeccin es [place]. Si la causa es un virus, los sntomas Teaching laboratory technician solos en el trmino de [address]. Pueden darle medicamentos para aliviar los sntomas. Incluyen lo siguiente: Medicamentos para encoger las fosas nasales hinchadas (descongestivos). Un aerosol que [employer] inflamacin de las fosas nasales (corticoesteroide intranasal tpico). Enjuagues que ayudan a eliminar la mucosidad espesa de la nariz (lavados con solucin salina nasal). Medicamentos para tratar alergias (antihistamnicos). Analgsicos de [employer]. Si la causa son bacterias, el mdico puede recomendarle que espere para ver si los sntomas mejoran. La mayora de las infecciones bacterianas mejoran sin medicamentos antibiticos. Posiblemente le den antibiticos si usted tiene: Una infeccin grave. El sistema inmunitario debilitado. Si la causa  es un estrechamiento de las fosas nasales o la  presencia de plipos nasales, es posible que necesite [place]. Siga estas instrucciones en su casa: [employer], use o aplquese los medicamentos de venta libre y Building control surveyor como se lo haya indicado el mdico. Estos pueden incluir aerosoles nasales. Si le recetaron un antibitico, tmelo como se lo haya indicado el mdico. No deje de tomar el antibitico aunque comience a sentirse mejor. Hidrtese y humidifique los [employer] suficiente lquido como para Pharmacologist la orina de color amarillo plido. Mantenerse hidratado lo ayudar a [employer]. Use un humidificador de vapor fro para mantener la humedad de su hogar por encima del 50 %. Realice inhalaciones de vapor por 10 a 15 minutos, de 3 a 4 veces al da, o como se lo haya indicado el mdico. Puede hacer esto en el bao con el vapor del agua caliente de la ducha. Limite la exposicin al aire fro o seco. Reposo Descanse todo lo que pueda. Duerma con la cabeza levantada (elevada). Asegrese de dormir lo suficiente cada noche. Instrucciones generales  Aplquese un pao tibio y hmedo en la cara 3 o 4 veces al da o como se lo haya indicado el mdico. Esto ayuda a Optician, dispensing las [place]. Hgase lavados con solucin salina nasal con la frecuencia que le haya indicado el mdico. Lvese las manos frecuentemente con agua y jabn para reducir la exposicin a los grmenes. Use desinfectante para manos si no dispone de [place] y [place]. No fume. Evite estar cerca de personas que fuman (fumador pasivo). Concurra a todas las visitas de seguimiento. Esto es importante. Comunquese con un mdico si: Tiene fiebre. Sus sntomas empeoran. Los sntomas no mejoran en el trmino de [address]. Solicite ayuda de inmediato si: Tiene un dolor de cabeza intenso. Tiene vmitos persistentes. Tiene dolor intenso o hinchazn en la zona del rostro o los ojos. Tiene problemas de visin. Presenta  confusin. Tiene el cuello rgido. Tiene dificultad para respirar. Estos sntomas pueden Customer service manager. Solicite ayuda de inmediato. Llame al 911. No espere a ver si los sntomas desaparecen. No conduzca por sus propios medios [employer]. Resumen La infeccin de los senos paranasales es el dolor y la inflamacin de los senos paranasales. Los senos paranasales son espacios vacos en los huesos alrededor del rostro. La causa de esta afeccin es la inflamacin o hinchazn de los tejidos [place]. La hinchazn atrapa u obstruye el flujo de la mucosidad. Esto fomenta la proliferacin de bacterias, virus y hongos, lo que produce infecciones. Si le recetaron un antibitico, tmelo como se lo haya indicado el mdico. No deje de tomar el antibitico aunque comience a sentirse mejor. Concurra a todas las visitas de seguimiento. Esto es importante. Esta informacin no tiene Theme park manager el consejo del mdico. Asegrese de hacerle al mdico cualquier pregunta que tenga. Document Revised: 07/12/2021 Document Reviewed: 07/12/2021 Elsevier Patient Education  2024 [employer].

## 2023-10-24 ENCOUNTER — Encounter: Payer: Self-pay | Admitting: Family Medicine

## 2023-10-24 LAB — CBC WITH DIFFERENTIAL/PLATELET
Basophils Absolute: 0 10*3/uL (ref 0.0–0.2)
Basos: 0 %
EOS (ABSOLUTE): 0.2 10*3/uL (ref 0.0–0.4)
Eos: 1 %
Hematocrit: 40.2 % (ref 34.0–46.6)
Hemoglobin: 13.6 g/dL (ref 11.1–15.9)
Immature Grans (Abs): 0 10*3/uL (ref 0.0–0.1)
Immature Granulocytes: 0 %
Lymphocytes Absolute: 1.9 10*3/uL (ref 0.7–3.1)
Lymphs: 18 %
MCH: 32.5 pg (ref 26.6–33.0)
MCHC: 33.8 g/dL (ref 31.5–35.7)
MCV: 96 fL (ref 79–97)
Monocytes Absolute: 0.8 10*3/uL (ref 0.1–0.9)
Monocytes: 8 %
Neutrophils Absolute: 7.4 10*3/uL — ABNORMAL HIGH (ref 1.4–7.0)
Neutrophils: 73 %
Platelets: 220 10*3/uL (ref 150–450)
RBC: 4.19 x10E6/uL (ref 3.77–5.28)
RDW: 12.6 % (ref 11.7–15.4)
WBC: 10.4 10*3/uL (ref 3.4–10.8)

## 2023-10-24 LAB — LP+NON-HDL CHOLESTEROL
Cholesterol, Total: 134 mg/dL (ref 100–199)
HDL: 35 mg/dL — ABNORMAL LOW (ref >39)
LDL Chol Calc (NIH): 85 mg/dL (ref 0–99)
Total Non-HDL-Chol (LDL+VLDL): 99 mg/dL (ref 0–129)
Triglycerides: 70 mg/dL (ref 0–149)
VLDL Cholesterol Cal: 14 mg/dL (ref 5–40)

## 2023-10-24 LAB — CMP14+EGFR
ALT: 74 IU/L — ABNORMAL HIGH (ref 0–32)
AST: 32 IU/L (ref 0–40)
Albumin: 4.6 g/dL (ref 3.9–4.9)
Alkaline Phosphatase: 95 IU/L (ref 44–121)
BUN/Creatinine Ratio: 13 (ref 9–23)
BUN: 9 mg/dL (ref 6–20)
Bilirubin Total: 0.6 mg/dL (ref 0.0–1.2)
CO2: 23 mmol/L (ref 20–29)
Calcium: 9.4 mg/dL (ref 8.7–10.2)
Chloride: 105 mmol/L (ref 96–106)
Creatinine, Ser: 0.71 mg/dL (ref 0.57–1.00)
Globulin, Total: 2.5 g/dL (ref 1.5–4.5)
Glucose: 92 mg/dL (ref 70–99)
Potassium: 3.8 mmol/L (ref 3.5–5.2)
Sodium: 143 mmol/L (ref 134–144)
Total Protein: 7.1 g/dL (ref 6.0–8.5)
eGFR: 114 mL/min/{1.73_m2} (ref >59)

## 2023-10-24 LAB — HEMOGLOBIN A1C
Est. average glucose Bld gHb Est-mCnc: 105 mg/dL
Hgb A1c MFr Bld: 5.3 % (ref 4.8–5.6)

## 2023-10-24 LAB — HEPATITIS A ANTIBODY, IGM: Hep A IgM: NEGATIVE

## 2023-10-24 LAB — HEPATITIS A ANTIBODY, TOTAL: hep A Total Ab: POSITIVE — AB

## 2023-10-30 ENCOUNTER — Ambulatory Visit: Payer: Self-pay

## 2023-10-30 ENCOUNTER — Other Ambulatory Visit: Payer: Self-pay

## 2023-10-30 DIAGNOSIS — R112 Nausea with vomiting, unspecified: Secondary | ICD-10-CM | POA: Diagnosis not present

## 2023-10-30 DIAGNOSIS — R590 Localized enlarged lymph nodes: Secondary | ICD-10-CM | POA: Diagnosis not present

## 2023-10-30 DIAGNOSIS — R059 Cough, unspecified: Secondary | ICD-10-CM | POA: Diagnosis not present

## 2023-10-30 DIAGNOSIS — U071 COVID-19: Secondary | ICD-10-CM | POA: Diagnosis not present

## 2023-10-30 DIAGNOSIS — R7401 Elevation of levels of liver transaminase levels: Secondary | ICD-10-CM | POA: Diagnosis not present

## 2023-10-30 DIAGNOSIS — R197 Diarrhea, unspecified: Secondary | ICD-10-CM | POA: Diagnosis not present

## 2023-10-30 DIAGNOSIS — R7989 Other specified abnormal findings of blood chemistry: Secondary | ICD-10-CM | POA: Diagnosis not present

## 2023-10-30 DIAGNOSIS — R0989 Other specified symptoms and signs involving the circulatory and respiratory systems: Secondary | ICD-10-CM | POA: Diagnosis not present

## 2023-10-30 NOTE — Telephone Encounter (Signed)
 Copied from CRM [id number]. Topic: Clinical - Red Word Triage >> Oct 30, 2023  8:17 AM Elle L wrote: Red Word that prompted transfer to Nurse Triage: On the line with interpreter [facility] ID [phone]. The patients lips and mouth is swollen and her neck is itchy and she is having general weakness and a worsening cough.   Chief Complaint: Facial swelling, dizziness, fever, cold sweats, chest pain, shortness of breath Symptoms: swelling, itching, sore throat, dry cough, general weakness, light sensitivity, chest pain mid sternal, rib pain, shortness of breath, diarrhea, vomiting Frequency: 3 days Pertinent Negatives: Patient denies toothache, leg swelling, history of blood clots Disposition: [] ED /[] Urgent Care (no appt availability in office) / [] Appointment(In office/virtual)/ []  [place] [facility]/ [] Home Care/ [x] Refused Recommended Disposition /[] [place] [facility]/ []  Follow-up with PCP Additional Notes: Patient called and advised that she had cold symptoms for over a month. She states that Sunday she started having swelling of her lips, left side of face swelling, and there is swelling on her neck. She advised her neck hurts, headache, and feeling weak, and has had dizziness. Patient states she was told Saturday (4 days ago) that her liver enzymes were high and it may be because of drinking coffee. Patient also states that she has had cold sweats, fevers, vomiting, diarrhea, midsternal chest pain, rib cage pain, shortness of breath, light sensitivity, and a dry cough. Patient advised that she experiences chest pain and shortness of breath when she takes a deep breath. Patient is advised that at this time with her symptoms to include facial swelling with itching, chest pain, shortness of breath, and when taking a deep breath it causes chest pain--it is recommended that she goes to the Emergency Room at this time for further evaluation of her symptoms. Patient states that she cannot go to the  Emergency Room right now and wanted to schedule an office appointment. This RN advised her that an office appointment could not be made at this time since it is recommended that she goes to the Emergency Room for her symptoms. Patient is advised that this message will be sent to her PCP so they are aware and patient is also advised that 911 is always available that she can call for an ambulance  Patient is asked if she wants an ambulance now and she declined. Patient is advised again at this time that she can call 911 at any time and if she has any other questions. Patient verbalized understanding.   Spanish Interpretation Services utilized during this encounter.    Reason for Disposition  Taking a deep breath makes pain worse  Answer Assessment - Initial Assessment Questions 1. ONSET: "When did the swelling start?" (e.g., minutes, hours, days)     3 days ago 2. LOCATION: "What part of the face is swollen?"     Left side of face, lips, and  3. SEVERITY: "How swollen is it?"     Lips--bigger than usual  Face--really heavy feeling and left eye feels very heavy 4. ITCHING: "Is there any itching?" If Yes, ask: "How much?"   (Scale 1-10; mild, moderate or severe)     Itchiness in lips -- 5 -- lips 5. PAIN: "Is the swelling painful to touch?" If Yes, ask: "How painful is it?"   (Scale 1-10; mild, moderate or severe)   - NONE (0): no pain   - MILD (1-3): doesn't interfere with normal activities    - MODERATE (4-7): interferes with normal activities or awakens from  sleep    - SEVERE (8-10): excruciating pain, unable to do any normal activities      9 6. FEVER: "Do you have a fever?" If Yes, ask: "What is it, how was it measured, and when did it start?"      "Fever the whole night" 7. CAUSE: "What do you think is causing the face swelling?"     unknown 8. RECURRENT SYMPTOM: "Have you had face swelling before?" If Yes, ask: "When was the last time?" "What happened that time?"     No 9. OTHER  SYMPTOMS: "Do you have any other symptoms?" (e.g., toothache, leg swelling)     "Cold symptoms--dry coughing, sore throat, general weakness, headache, light hurts vision" 10. PREGNANCY: "Is there any chance you are pregnant?" "When was your last menstrual period?"       Years since last period--using birth control method  Answer Assessment - Initial Assessment Questions 1. RESPIRATORY STATUS: "Describe your breathing?" (e.g., wheezing, shortness of breath, unable to speak, severe coughing)      Shortness of breath 2. ONSET: "When did this breathing problem begin?"      3 days ago 3. PATTERN "Does the difficult breathing come and go, or has it been constant since it started?"      Comes and goes 4. SEVERITY: "How bad is your breathing?" (e.g., mild, moderate, severe)    - MILD: No SOB at rest, mild SOB with walking, speaks normally in sentences, can lie down, no retractions, pulse < 100.    - MODERATE: SOB at rest, SOB with minimal exertion and prefers to sit, cannot lie down flat, speaks in phrases, mild retractions, audible wheezing, pulse 100-120.    - SEVERE: Very SOB at rest, speaks in single words, struggling to breathe, sitting hunched forward, retractions, pulse > 120      When she has the chest pressure 5. RECURRENT SYMPTOM: "Have you had difficulty breathing before?" If Yes, ask: "When was the last time?" and "What happened that time?"      N/a 6. CARDIAC HISTORY: "Do you have any history of heart disease?" (e.g., heart attack, angina, bypass surgery, angioplasty)      "Always cardiac issues but only when I'm pregnant"--pt denies any chance of pregnancy 7. LUNG HISTORY: "Do you have any history of lung disease?"  (e.g., pulmonary embolus, asthma, emphysema)     no 8. CAUSE: "What do you think is causing the breathing problem?"      unsure 9. OTHER SYMPTOMS: "Do you have any other symptoms? (e.g., dizziness, runny nose, cough, chest pain, fever)     Dry cough, fever last night,  chest pain 10. O2 SATURATION MONITOR:  "Do you use an oxygen saturation monitor (pulse oximeter) at home?" If Yes, ask: "What is your reading (oxygen level) today?" "What is your usual oxygen saturation reading?" (e.g., 95%)       N/a 11. PREGNANCY: "Is there any chance you are pregnant?" "When was your last menstrual period?"       Years since last period--using a birth control 96. TRAVEL: "Have you traveled out of the country in the last month?" (e.g., travel history, exposures)       N/a  Answer Assessment - Initial Assessment Questions 1. LOCATION: "Where does it hurt?"       Middle of chest 2. RADIATION: "Does the pain go anywhere else?" (e.g., into neck, jaw, arms, back)     Middle of chest 3. ONSET: "When did the chest pain begin?" (Minutes,  hours or days)      When coughing started 4. PATTERN: "Does the pain come and go, or has it been constant since it started?"  "Does it get worse with exertion?"      Comes and goes 5. DURATION: "How long does it last" (e.g., seconds, minutes, hours)     N/a 6. SEVERITY: "How bad is the pain?"  (e.g., Scale 1-10; mild, moderate, or severe)    - MILD (1-3): doesn't interfere with normal activities     - MODERATE (4-7): interferes with normal activities or awakens from sleep    - SEVERE (8-10): excruciating pain, unable to do any normal activities       5 7. CARDIAC RISK FACTORS: "Do you have any history of heart problems or risk factors for heart disease?" (e.g., angina, prior heart attack; diabetes, high blood pressure, high cholesterol, smoker, or strong family history of heart disease)     "Always had cardiac issues but only when pregnant"-- 8. PULMONARY RISK FACTORS: "Do you have any history of lung disease?"  (e.g., blood clots in lung, asthma, emphysema, birth control pills)     no 9. CAUSE: "What do you think is causing the chest pain?"     unsure 10. OTHER SYMPTOMS: "Do you have any other symptoms?" (e.g., dizziness, nausea, vomiting,  sweating, fever, difficulty breathing, cough)       Dry cough, shortness of breath, fever last night, vomiting, dizziness 11. PREGNANCY: "Is there any chance you are pregnant?" "When was your last menstrual period?"       No-- usesbirth control---last period years ago  Protocols used: Face Swelling-A-AH, Breathing Difficulty-A-AH, Chest Pain-A-AH

## 2023-10-30 NOTE — Telephone Encounter (Signed)
 Called [facility] to advise them of recommendation of E.D. and patient refusing E.D. at this time.

## 2023-10-31 ENCOUNTER — Other Ambulatory Visit: Payer: Self-pay

## 2023-11-05 ENCOUNTER — Ambulatory Visit: Payer: Self-pay

## 2023-11-05 NOTE — Telephone Encounter (Signed)
 Copied from CRM [id number]. Topic: Clinical - Red Word Triage >> Nov 05, 2023  8:17 AM Elle L wrote: Red Word that prompted transfer to Nurse Triage: I have interpreter ID [phone] on the line. The patient states every time she urinates she has excruciating pain. She was diagnosed with COVID on Wednesday at the hospital and they advised her that her liver enzymes were high as well.  Chief Complaint: burning with urination, blood in urine Symptoms: see above Frequency: constant Pertinent Negatives: Patient denies flank pain, abd pain, vaginal discharge Disposition: [] ED /[x] Urgent Care (no appt availability in office) / [] Appointment(In office/virtual)/ []  [place] [facility]/ [] Home Care/ [] Refused Recommended Disposition /[]  Mobile Bus/ []  Follow-up with PCP Additional Notes: no apt available; instructed to go to UC.  Care advice given, denies questions; instructed to go to ER if becomes worse.   Reason for Disposition  [1] SEVERE pain with urination (e.g., excruciating) AND [2] not improved after 2 hours of pain medicine and Sitz bath  Answer Assessment - Initial Assessment Questions 1. SEVERITY: "How bad is the pain?"  (e.g., Scale 1-10; mild, moderate, or severe)   - MILD (1-3): complains slightly about urination hurting   - MODERATE (4-7): interferes with normal activities     - SEVERE (8-10): excruciating, unwilling or unable to urinate because of the pain      severe 2. FREQUENCY: "How many times have you had painful urination today?"      10-11 times 3. PATTERN: "Is pain present every time you urinate or just sometimes?"      Yes, every time 4. ONSET: "When did the painful urination start?"      Last night 5. FEVER: "Do you have a fever?" If Yes, ask: "What is your temperature, how was it measured, and when did it start?"     chills 6. PAST UTI: "Have you had a urine infection before?" If Yes, ask: "When was the last time?" and "What happened that time?"      yes 7.  CAUSE: "What do you think is causing the painful urination?"  (e.g., UTI, scratch, Herpes sore)     yes 8. OTHER SYMPTOMS: "Do you have any other symptoms?" (e.g., blood in urine, flank pain, genital sores, urgency, vaginal discharge)     Blood in urine,  9. PREGNANCY: "Is there any chance you are pregnant?" "When was your last menstrual period?"     no  Protocols used: Urination Pain - Female-A-AH

## 2023-11-16 DIAGNOSIS — Z419 Encounter for procedure for purposes other than remedying health state, unspecified: Secondary | ICD-10-CM | POA: Diagnosis not present

## 2023-11-20 ENCOUNTER — Ambulatory Visit: Payer: Self-pay

## 2023-11-20 NOTE — Telephone Encounter (Signed)
 Noted.

## 2023-11-20 NOTE — Telephone Encounter (Signed)
 Copied from CRM [id number]. Topic: Clinical - Red Word Triage >> Nov 20, 2023 12:06 PM Marissa P wrote: Red Word that prompted transfer to Nurse Triage: Patient called stating that she is in pain. Face swollen, pain when bending, really tired and just overall pain according to patient. She would like to please be seen as soon as possible.   Chief Complaint: Fatigue  Symptoms: Increased fatigue, some memory problems, left sided facial swelling and numbness  Frequency: Constant  Disposition: [] ED /[] Urgent Care (no appt availability in office) / [x] Appointment(In office/virtual)/ []  [place] [facility]/ [] Home Care/ [] Refused Recommended Disposition /[] [place] [facility]/ []  Follow-up with PCP Additional Notes: Patient reports that she was seen 3 weeks ago at the ED and diagnosed with COVID. She states that since her COVID symptoms began she has been experiencing increased fatigue, memory problems, and some swelling and numbness/burning to the left side of her face. She states that the fatigue has been worsening and that she would like to be evaluated. Appointment made for the patient tomorrow for evaluation of her symptoms.     Reason for Disposition  [1] Fatigue (i.e., tires easily, decreased energy) AND [2] persists > 1 week  Answer Assessment - Initial Assessment Questions 1. DESCRIPTION: "Describe how you are feeling."     Wakes up feeling fagtigued  2. SEVERITY: "How bad is it?"  "Can you stand and walk?"   - MILD (0-3): Feels weak or tired, but does not interfere with work, school or normal activities.   - MODERATE (4-7): Able to stand and walk; weakness interferes with work, school, or normal activities.   - SEVERE (8-10): Unable to stand or walk; unable to do usual activities.     Mild 3. ONSET: "When did these symptoms begin?" (e.g., hours, days, weeks, months)     3 weeks 4. CAUSE: "What do you think is causing the weakness or fatigue?" (e.g., not drinking enough fluids,  medical problem, trouble sleeping)     Unsure  5. NEW MEDICINES:  "Have you started on any new medicines recently?" (e.g., opioid pain medicines, benzodiazepines, muscle relaxants, antidepressants, antihistamines, neuroleptics, beta blockers)     No 6. OTHER SYMPTOMS: "Do you have any other symptoms?" (e.g., chest pain, fever, cough, SOB, vomiting, diarrhea, bleeding, other areas of pain)     Some increased confusion, some left sided facial swelling and burning sensation  7. PREGNANCY: "Is there any chance you are pregnant?" "When was your last menstrual period?"     No  Protocols used: Weakness (Generalized) and Fatigue-A-AH

## 2023-11-21 ENCOUNTER — Encounter: Payer: Self-pay | Admitting: Family Medicine

## 2023-11-21 ENCOUNTER — Other Ambulatory Visit: Payer: Self-pay

## 2023-11-21 ENCOUNTER — Ambulatory Visit: Attending: Family Medicine | Admitting: Family Medicine

## 2023-11-21 VITALS — BP 120/81 | HR 95 | Temp 98.5°F | Ht 61.0 in | Wt 178.2 lb

## 2023-11-21 DIAGNOSIS — R5383 Other fatigue: Secondary | ICD-10-CM | POA: Diagnosis not present

## 2023-11-21 DIAGNOSIS — U099 Post covid-19 condition, unspecified: Secondary | ICD-10-CM | POA: Diagnosis not present

## 2023-11-21 DIAGNOSIS — R7989 Other specified abnormal findings of blood chemistry: Secondary | ICD-10-CM | POA: Diagnosis not present

## 2023-11-21 DIAGNOSIS — J320 Chronic maxillary sinusitis: Secondary | ICD-10-CM

## 2023-11-21 DIAGNOSIS — L819 Disorder of pigmentation, unspecified: Secondary | ICD-10-CM

## 2023-11-21 DIAGNOSIS — R4189 Other symptoms and signs involving cognitive functions and awareness: Secondary | ICD-10-CM

## 2023-11-21 MED ORDER — CETIRIZINE HCL 10 MG PO TABS: 10.0000 mg | ORAL_TABLET | Freq: Every day | ORAL | 1 refills | Status: AC

## 2023-11-21 MED ORDER — FLUTICASONE PROPIONATE 50 MCG/ACT NA SUSP: 2.0000 | Freq: Every day | NASAL | 1 refills | Status: DC

## 2023-11-21 NOTE — Progress Notes (Signed)
 Subjective:  Patient ID: Alyssa Blackwell, female    DOB: January 15, 1986  Age: 38 y.o. MRN: [id number]  CC: Fatigue     Discussed the use of AI scribe software for clinical note transcription with the patient, who gave verbal consent to proceed.  History of Present Illness The patient, with a history of COVID-19 three weeks ago, presents with persistent fatigue, left facial numbness, and cognitive changes. She describes the left side of her face as 'extra sensitive' and reports a 'burning sensation.'  She has pressure on the left side of her face and also burning sensation in her left nostril.  She also notes episodes of 'brain fog,' with forgetfulness and mental blankness. She has been feeling excessively tired, to the point of nearly falling asleep while standing and cooking. She had an ED visit where labs had revealed elevated LFTs along with a positive COVID test.  In addition to these symptoms, she reports a history of 'chronic sinusitis' and has been experiencing facial swelling.  She had been referred to ENT last year but was unable to make appointment.  Currently not taking any medication for chronic sinusitis.   She was previously prescribed a cream for 'whitish spots' on her face, but has not been using it for the past six months, during which time the spots have returned.  She saw a dermatologist in [place] and was prescribed hydroquinone.  She also mentions receiving a prescription for tretinoin which she combined with the hydroquinone    Past Medical History:  Diagnosis Date   Arrhythmia    occurs only in pregnancy, had evaluation in [place] with 5th pregnancy, normal evaluation per pt   Panic attacks     Past Surgical History:  Procedure Laterality Date   CARPAL TUNNEL RELEASE Right 01/15/2022   Procedure: RIGHT CARPAL TUNNEL RELEASE;  Surgeon: Marlyne Beards, MD;  Location: [facility];  Service: Orthopedics;  Laterality: Right;   CARPAL  TUNNEL RELEASE Left 03/19/2022   Procedure: LEFT CARPAL TUNNEL RELEASE;  Surgeon: Marlyne Beards, MD;  Location: [facility];  Service: Orthopedics;  Laterality: Left;    Family History  Problem Relation Age of Onset   Heart attack Father    Heart disease Father    Hypertension Paternal Grandmother    Healthy Mother     Social History   Socioeconomic History   Marital status: Married    Spouse name: Not on file   Number of children: Not on file   Years of education: Not on file   Highest education level: Not on file  Occupational History   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  Tobacco Use   Smoking status: Never   Smokeless tobacco: Never  Vaping Use   Vaping status: Never Used  Substance and Sexual Activity   Alcohol use: Never   Drug use: Never   Sexual activity: Yes    Birth control/protection: None  Other Topics Concern   Not on file  Social History Narrative   Not on file   Social Drivers of Health   Financial Resource Strain: Low Risk  (10/07/2023)   Received from [facility]   Overall Financial Resource Strain (CARDIA)    Difficulty of Paying Living Expenses: Not very hard  Food Insecurity: No Food Insecurity (10/07/2023)   Received from [facility]   Hunger Vital Sign    Worried About Running Out of Food in the Last Year: Never true    Ran Out of Food in the Last Year:  Never true  Transportation Needs: No Transportation Needs (10/07/2023)   Received from [facility] - Transportation    Lack of Transportation (Medical): No    Lack of Transportation (Non-Medical): No  Physical Activity: Not on file  Stress: No Stress Concern Present (06/19/2023)   [employer] of Occupational Health - Occupational Stress Questionnaire    Feeling of Stress : Not at all  Social Connections: Moderately Integrated (06/19/2023)   Social Connection and Isolation Panel [NHANES]    Frequency of Communication with Friends and Family: Once a week    Frequency  of Social Gatherings with Friends and Family: Once a week    Attends Religious Services: 1 to 4 times per year    Active Member of [employer] or Organizations: Yes    Attends Banker Meetings: 1 to 4 times per year    Marital Status: Married    No Known Allergies  Outpatient Medications Prior to Visit  Medication Sig Dispense Refill   acetaminophen (TYLENOL) 500 MG tablet Take 1 tablet (500 mg total) by mouth every 6 (six) hours. 30 tablet 0   butalbital-acetaminophen-caffeine (FIORICET) 50-325-40 MG tablet Take 1 tablet by mouth every 6 (six) hours as needed for headache. 30 tablet 0   chlorpheniramine-HYDROcodone (TUSSIONEX) 10-8 MG/5ML Take 5 mLs by mouth every 12 (twelve) hours as needed for cough. 120 mL 0   dicyclomine (BENTYL) 10 MG capsule Take 1 capsule (10 mg total) by mouth 3 (three) times daily before meals. 90 capsule 1   ibuprofen (ADVIL) 800 MG tablet Take 1 tablet (800 mg total) by mouth 3 (three) times daily. 21 tablet 0   ketoconazole (NIZORAL) 2 % shampoo Apply 1 Application topically 2 (two) times a week. 49 mL 3   ondansetron (ZOFRAN) 4 MG tablet Take 1 tablet (4 mg total) by mouth every 8 (eight) hours as needed for nausea or vomiting. 20 tablet 0   predniSONE (DELTASONE) 20 MG tablet Take 1 tablet (20 mg total) by mouth daily with breakfast. 5 tablet 0   Semaglutide-Weight Management (WEGOVY) 1.7 MG/0.75ML SOAJ Inject 1.7 mg into the skin once a week. 3 mL 0   tretinoin (RETIN-A) 0.05 % cream Apply topically at bedtime. 45 g 2   cetirizine (ZYRTEC) 10 MG tablet Take 1 tablet (10 mg total) by mouth daily. 90 tablet 1   Semaglutide-Weight Management (WEGOVY) 0.25 MG/0.5ML SOAJ Inject 0.25 mg into the skin once a week. 2 mL 0   Semaglutide-Weight Management (WEGOVY) 0.5 MG/0.5ML SOAJ Inject 0.5 mg into the skin once a week. 2 mL 0   Semaglutide-Weight Management (WEGOVY) 1 MG/0.5ML SOAJ Inject 1 mg into the skin once a week. 2 mL 0   Semaglutide-Weight Management  (WEGOVY) 1.7 MG/0.75ML SOAJ Inject 1.7 mg into the skin once a week. 3 mL 0   No facility-administered medications prior to visit.     ROS Review of Systems  Constitutional:  Positive for fatigue. Negative for activity change and appetite change.  HENT:  Negative for sinus pressure and sore throat.   Respiratory:  Negative for chest tightness, shortness of breath and wheezing.   Cardiovascular:  Negative for chest pain and palpitations.  Gastrointestinal:  Negative for abdominal distention, abdominal pain and constipation.  Genitourinary: Negative.   Musculoskeletal: Negative.   Psychiatric/Behavioral:  Negative for behavioral problems and dysphoric mood.     Objective:  BP 120/81 (BP Location: Left Arm, Patient Position: Sitting, Cuff Size: Normal)   Pulse 95  Temp 98.5 F (36.9 C) (Oral)   Ht 5\' 1"  (1.549 m)   Wt 178 lb 3.2 oz (80.8 kg)   SpO2 99%   BMI 33.67 kg/m      11/21/2023   11:18 AM 11/21/2023   11:07 AM 10/23/2023   11:23 AM  BP/Weight  Systolic BP 120 97 110  Diastolic BP 81 65 74  Wt. (Lbs)  178.2 181.8  BMI  33.67 kg/m2 35.51 kg/m2      Physical Exam Constitutional:      Appearance: She is well-developed.  Cardiovascular:     Rate and Rhythm: Normal rate.     Heart sounds: Normal heart sounds. No murmur heard. Pulmonary:     Effort: Pulmonary effort is normal.     Breath sounds: Normal breath sounds. No wheezing or rales.  Chest:     Chest wall: No tenderness.  Abdominal:     General: Bowel sounds are normal. There is no distension.     Palpations: Abdomen is soft. There is no mass.     Tenderness: There is no abdominal tenderness.  Musculoskeletal:        General: Normal range of motion.     Right lower leg: No edema.     Left lower leg: No edema.  Neurological:     Mental Status: She is alert and oriented to person, place, and time.  Psychiatric:        Mood and Affect: Mood normal.        Latest Ref Rng & Units 10/23/2023   11:49 AM  08/29/2022   12:15 PM 12/08/2021    4:40 PM  CMP  Glucose 70 - 99 mg/dL 92  87    BUN 6 - 20 mg/dL 9  12    Creatinine 4.09 - 1.00 mg/dL 8.11  9.14    Sodium 782 - 144 mmol/L 143  143    Potassium 3.5 - 5.2 mmol/L 3.8  4.3    Chloride 96 - 106 mmol/L 105  105    CO2 20 - 29 mmol/L 23  22    Calcium 8.7 - 10.2 mg/dL 9.4  9.8    Total Protein 6.0 - 8.5 g/dL 7.1   7.8   Total Bilirubin 0.0 - 1.2 mg/dL 0.6   0.5   Alkaline Phos 44 - 121 IU/L 95   72   AST 0 - 40 IU/L 32   17   ALT 0 - 32 IU/L 74   34     Lipid Panel     Component Value Date/Time   CHOL 134 10/23/2023 1149   TRIG 70 10/23/2023 1149   HDL 35 (L) 10/23/2023 1149   LDLCALC 85 10/23/2023 1149    CBC    Component Value Date/Time   WBC 10.4 10/23/2023 1149   WBC 12.4 (H) 01/19/2021 0128   RBC 4.19 10/23/2023 1149   RBC 4.09 01/19/2021 0128   HGB 13.6 10/23/2023 1149   HCT 40.2 10/23/2023 1149   PLT 220 10/23/2023 1149   MCV 96 10/23/2023 1149   MCH 32.5 10/23/2023 1149   MCH 31.8 01/19/2021 0128   MCHC 33.8 10/23/2023 1149   MCHC 33.4 01/19/2021 0128   RDW 12.6 10/23/2023 1149   LYMPHSABS 1.9 10/23/2023 1149   EOSABS 0.2 10/23/2023 1149   BASOSABS 0.0 10/23/2023 1149    Lab Results  Component Value Date   HGBA1C 5.3 10/23/2023       Assessment & Plan Post-COVID Syndrome Fatigue likely  due to deconditioning with associated brain fog . Discussed that these are symptoms related to post-COVID syndrome.   Elevated liver enzymes likely COVID-19 related. - Recommend rest and gradual increase in activity. - Advise over-the-counter multivitamins. - Repeat liver enzyme tests.  Facial Sensitivity and Swelling Sensitivity and burning on left face likely sinus-related. Missed ENT appointment for chronic sinusitis. - Prescribe steroid nasal spray. - Prescribe sinus medication. - Provide ENT contact for rescheduling.  Facial Hyperpigmentation Recurrence of facial spots after stopping hydroquinone cream.  Prefers dermatologist referral. - Refer to dermatologist for management and medication.      Meds ordered this encounter  Medications   fluticasone (FLONASE) 50 MCG/ACT nasal spray    Sig: Place 2 sprays into both nostrils daily.    Dispense:  16 g    Refill:  1   cetirizine (ZYRTEC) 10 MG tablet    Sig: Take 1 tablet (10 mg total) by mouth daily.    Dispense:  90 tablet    Refill:  1    Follow-up: Return in about 1 year (around 11/20/2024), or if symptoms worsen or fail to improve, for CPE/ Preventive Health Exam.       Joaquin Mulberry, MD, FAAFP. [facility] [place], [place] [phone]   11/21/2023, 1:34 PM

## 2023-11-21 NOTE — Patient Instructions (Signed)
Sent Referral to [facility] ph. # [phone] Fax# [phone] address [address]. They will contact the patient to schedule an appointment  

## 2023-11-22 ENCOUNTER — Encounter: Payer: Self-pay | Admitting: Family Medicine

## 2023-11-22 ENCOUNTER — Other Ambulatory Visit: Payer: Self-pay

## 2023-11-22 LAB — CMP14+EGFR
ALT: 53 IU/L — ABNORMAL HIGH (ref 0–32)
AST: 28 IU/L (ref 0–40)
Albumin: 4.8 g/dL (ref 3.9–4.9)
Alkaline Phosphatase: 87 IU/L (ref 44–121)
BUN/Creatinine Ratio: 12 (ref 9–23)
BUN: 9 mg/dL (ref 6–20)
Bilirubin Total: 0.5 mg/dL (ref 0.0–1.2)
CO2: 21 mmol/L (ref 20–29)
Calcium: 9.7 mg/dL (ref 8.7–10.2)
Chloride: 105 mmol/L (ref 96–106)
Creatinine, Ser: 0.77 mg/dL (ref 0.57–1.00)
Globulin, Total: 2.2 g/dL (ref 1.5–4.5)
Glucose: 81 mg/dL (ref 70–99)
Potassium: 3.9 mmol/L (ref 3.5–5.2)
Sodium: 142 mmol/L (ref 134–144)
Total Protein: 7 g/dL (ref 6.0–8.5)
eGFR: 103 mL/min/{1.73_m2} (ref >59)

## 2023-12-16 DIAGNOSIS — Z3009 Encounter for other general counseling and advice on contraception: Secondary | ICD-10-CM | POA: Diagnosis not present

## 2023-12-16 DIAGNOSIS — Z3042 Encounter for surveillance of injectable contraceptive: Secondary | ICD-10-CM | POA: Diagnosis not present

## 2023-12-16 DIAGNOSIS — Z419 Encounter for procedure for purposes other than remedying health state, unspecified: Secondary | ICD-10-CM | POA: Diagnosis not present

## 2024-01-16 DIAGNOSIS — Z419 Encounter for procedure for purposes other than remedying health state, unspecified: Secondary | ICD-10-CM | POA: Diagnosis not present

## 2024-01-20 DIAGNOSIS — E669 Obesity, unspecified: Secondary | ICD-10-CM | POA: Diagnosis not present

## 2024-01-25 ENCOUNTER — Other Ambulatory Visit: Payer: Self-pay | Admitting: Family Medicine

## 2024-02-15 DIAGNOSIS — Z419 Encounter for procedure for purposes other than remedying health state, unspecified: Secondary | ICD-10-CM | POA: Diagnosis not present

## 2024-03-11 DIAGNOSIS — L299 Pruritus, unspecified: Secondary | ICD-10-CM | POA: Diagnosis not present

## 2024-03-11 DIAGNOSIS — L819 Disorder of pigmentation, unspecified: Secondary | ICD-10-CM | POA: Diagnosis not present

## 2024-03-17 DIAGNOSIS — Z419 Encounter for procedure for purposes other than remedying health state, unspecified: Secondary | ICD-10-CM | POA: Diagnosis not present

## 2024-04-13 ENCOUNTER — Ambulatory Visit: Payer: Self-pay

## 2024-04-13 ENCOUNTER — Encounter: Payer: Self-pay | Admitting: Family Medicine

## 2024-04-13 NOTE — Telephone Encounter (Signed)
 Pt requesting Rx refill for medication not active in profile - meloxicam . This encounter was created in error - please disregard.

## 2024-04-13 NOTE — Telephone Encounter (Signed)
 FYI Only or Action Required?: Action required by provider: request for appointment, medication refill request, and referral request.  Patient was last seen in primary care on 11/21/2023 by Alyssa Clam, MD.  Called Nurse Triage reporting Numbness.  Symptoms began about a month ago.  Interventions attempted: Prescription medications: meloxicam  and Rest, hydration, or home remedies.  Symptoms are: unchanged.  Triage Disposition: See PCP When Office is Open (Within 3 Days)  Patient/caregiver understands and will follow disposition?: No, wishes to speak with PCP      Copied from CRM #[id number]. Topic: Clinical - Red Word Triage >> Apr 13, 2024  3:44 PM DeAngela L wrote: Red Word that prompted transfer to Nurse Triage: Patient has been having numbness in arms, right foot and ankle for a month and it got worse 2 weeks ago   Pt num [phone] (M)  Interpreter [phone] used. Reason for Disposition  [1] Numbness or tingling in one or both hands AND [2] is a chronic symptom (recurrent or ongoing AND present > 4 weeks)  Answer Assessment - Initial Assessment Questions 1. SYMPTOM: What is the main symptom you are concerned about? (e.g., weakness, numbness)     Numbness/tingling in arms, R foot, knees It feels like I woke up with anestesia 2. ONSET: When did this start? (e.g., minutes, hours, days; while sleeping)     X 1 mos 3. LAST NORMAL: When was the last time you (the patient) were normal (no symptoms)?     N/a 4. PATTERN Does this come and go, or has it been constant since it started?  Is it present now?     Comes and goes - endorses sx are sometimes positional - lasts 10-20 minutes upon waking  5. CARDIAC SYMPTOMS: Have you had any of the following symptoms: chest pain, difficulty breathing, palpitations?     Denies Does endorse intermittent chest soreness - unable to describe  6. NEUROLOGIC SYMPTOMS: Have you had any of the following symptoms: headache, dizziness,  vision loss, double vision, changes in speech, unsteady on your feet?     Numbness in 2 left hand fingers Dizziness, endorses sparks in vision Able to walk Unable to hold objects > 2 lbs in hand 7. OTHER SYMPTOMS: Do you have any other symptoms?     Tingling in junctures of feet/knees 8. PREGNANCY: Is there any chance you are pregnant? When was your last menstrual period?     N/a    Triager attempted to schedule with PCP, but no access available. Triager advised UC, but pt declined d/t poor experiences.   Pt is requesting refill of meloxicam  (a discontinued med) and orthopedic referral. Triager will forward encounter for Dr Alyssa 's office to review and advise. Patient verbalized understanding and is expecting call back from office for next steps. Triager also advised that if pt does not hear back from office, to follow disposition for further evaluation/treatment.  Protocols used: Neurologic Deficit-A-AH

## 2024-04-14 ENCOUNTER — Telehealth: Payer: Self-pay | Admitting: Family Medicine

## 2024-04-14 DIAGNOSIS — G5600 Carpal tunnel syndrome, unspecified upper limb: Secondary | ICD-10-CM

## 2024-04-14 NOTE — Telephone Encounter (Signed)
 Call to patient with interpreter to schedule unable to reach message left.

## 2024-04-14 NOTE — Telephone Encounter (Signed)
 Let me know if you want me to sch appt  Copied from CRM #[id number]. Topic: Referral - Question >> Apr 13, 2024  3:48 PM DeAngela L wrote: Reason for CRM: patient would like a referral to the orthopedic doctor, carpal tunnel concerns and the patient has already had surgery but she is having concerns again   Pt num [phone] (M)

## 2024-04-15 NOTE — Addendum Note (Signed)
 Addended by: Taevin Mcferran on: 04/15/2024 05:21 PM   Modules accepted: Orders

## 2024-04-15 NOTE — Telephone Encounter (Signed)
 Routing to PCP for review.

## 2024-04-15 NOTE — Telephone Encounter (Signed)
 Referral has been placed.

## 2024-04-16 NOTE — Telephone Encounter (Signed)
 LVM informing patient.

## 2024-04-17 DIAGNOSIS — Z419 Encounter for procedure for purposes other than remedying health state, unspecified: Secondary | ICD-10-CM | POA: Diagnosis not present

## 2024-04-20 DIAGNOSIS — E669 Obesity, unspecified: Secondary | ICD-10-CM | POA: Diagnosis not present

## 2024-04-28 ENCOUNTER — Ambulatory Visit: Admitting: Family Medicine

## 2024-04-28 DIAGNOSIS — E669 Obesity, unspecified: Secondary | ICD-10-CM | POA: Diagnosis not present

## 2024-04-29 DIAGNOSIS — Z789 Other specified health status: Secondary | ICD-10-CM | POA: Diagnosis not present

## 2024-04-29 DIAGNOSIS — Z114 Encounter for screening for human immunodeficiency virus [HIV]: Secondary | ICD-10-CM | POA: Diagnosis not present

## 2024-04-29 DIAGNOSIS — Z131 Encounter for screening for diabetes mellitus: Secondary | ICD-10-CM | POA: Diagnosis not present

## 2024-04-29 DIAGNOSIS — Z3202 Encounter for pregnancy test, result negative: Secondary | ICD-10-CM | POA: Diagnosis not present

## 2024-04-29 DIAGNOSIS — Z30013 Encounter for initial prescription of injectable contraceptive: Secondary | ICD-10-CM | POA: Diagnosis not present

## 2024-04-29 DIAGNOSIS — Z3009 Encounter for other general counseling and advice on contraception: Secondary | ICD-10-CM | POA: Diagnosis not present

## 2024-04-29 DIAGNOSIS — Z113 Encounter for screening for infections with a predominantly sexual mode of transmission: Secondary | ICD-10-CM | POA: Diagnosis not present

## 2024-05-05 DIAGNOSIS — G5623 Lesion of ulnar nerve, bilateral upper limbs: Secondary | ICD-10-CM | POA: Diagnosis not present

## 2024-05-05 DIAGNOSIS — G5603 Carpal tunnel syndrome, bilateral upper limbs: Secondary | ICD-10-CM | POA: Diagnosis not present

## 2024-05-25 DIAGNOSIS — M25511 Pain in right shoulder: Secondary | ICD-10-CM | POA: Diagnosis not present

## 2024-05-25 DIAGNOSIS — M25512 Pain in left shoulder: Secondary | ICD-10-CM | POA: Diagnosis not present

## 2024-06-08 ENCOUNTER — Encounter: Payer: Self-pay | Admitting: Radiology

## 2024-06-29 DIAGNOSIS — E669 Obesity, unspecified: Secondary | ICD-10-CM | POA: Diagnosis not present

## 2024-06-29 DIAGNOSIS — R000 Tachycardia, unspecified: Secondary | ICD-10-CM | POA: Diagnosis not present

## 2024-06-30 DIAGNOSIS — G5623 Lesion of ulnar nerve, bilateral upper limbs: Secondary | ICD-10-CM | POA: Diagnosis not present

## 2024-06-30 DIAGNOSIS — G5603 Carpal tunnel syndrome, bilateral upper limbs: Secondary | ICD-10-CM | POA: Diagnosis not present

## 2024-06-30 DIAGNOSIS — G5622 Lesion of ulnar nerve, left upper limb: Secondary | ICD-10-CM | POA: Diagnosis not present

## 2024-06-30 DIAGNOSIS — G5621 Lesion of ulnar nerve, right upper limb: Secondary | ICD-10-CM | POA: Diagnosis not present

## 2024-07-07 DIAGNOSIS — G5603 Carpal tunnel syndrome, bilateral upper limbs: Secondary | ICD-10-CM | POA: Diagnosis not present

## 2024-07-07 DIAGNOSIS — G5623 Lesion of ulnar nerve, bilateral upper limbs: Secondary | ICD-10-CM | POA: Diagnosis not present
